# Patient Record
Sex: Male | Born: 1975
Health system: Southern US, Community
[De-identification: ages and names within clinical notes are randomized; demographics above are authoritative.]

## PROBLEM LIST (undated history)

## (undated) DIAGNOSIS — R569 Unspecified convulsions: Secondary | ICD-10-CM

## (undated) DIAGNOSIS — K219 Gastro-esophageal reflux disease without esophagitis: Secondary | ICD-10-CM

## (undated) HISTORY — PX: OTHER SURGICAL HISTORY: SHX169

---

## 1998-03-03 ENCOUNTER — Ambulatory Visit (HOSPITAL_COMMUNITY): Admission: RE | Admit: 1998-03-03 | Discharge: 1998-03-03 | Payer: Self-pay | Admitting: Neurology

## 2010-10-14 ENCOUNTER — Ambulatory Visit: Payer: Self-pay | Admitting: Orthopedic Surgery

## 2011-03-15 ENCOUNTER — Emergency Department (HOSPITAL_COMMUNITY)
Admission: EM | Admit: 2011-03-15 | Discharge: 2011-03-16 | Disposition: A | Discharge status: Home / Self Care | Payer: Commercial Managed Care - PPO | Attending: Emergency Medicine | Admitting: Emergency Medicine

## 2011-03-15 ENCOUNTER — Encounter: Payer: Self-pay | Admitting: *Deleted

## 2011-03-15 DIAGNOSIS — A084 Viral intestinal infection, unspecified: Secondary | ICD-10-CM

## 2011-03-15 DIAGNOSIS — A088 Other specified intestinal infections: Secondary | ICD-10-CM | POA: Insufficient documentation

## 2011-03-15 DIAGNOSIS — R197 Diarrhea, unspecified: Secondary | ICD-10-CM | POA: Insufficient documentation

## 2011-03-15 DIAGNOSIS — R569 Unspecified convulsions: Secondary | ICD-10-CM | POA: Insufficient documentation

## 2011-03-15 DIAGNOSIS — R51 Headache: Secondary | ICD-10-CM | POA: Insufficient documentation

## 2011-03-15 DIAGNOSIS — K219 Gastro-esophageal reflux disease without esophagitis: Secondary | ICD-10-CM | POA: Insufficient documentation

## 2011-03-15 DIAGNOSIS — R112 Nausea with vomiting, unspecified: Secondary | ICD-10-CM | POA: Insufficient documentation

## 2011-03-15 HISTORY — DX: Gastro-esophageal reflux disease without esophagitis: K21.9

## 2011-03-15 HISTORY — DX: Unspecified convulsions: R56.9

## 2011-03-15 LAB — BASIC METABOLIC PANEL
CO2: 24 mEq/L (ref 19–32)
Chloride: 98 mEq/L (ref 96–112)
Sodium: 135 mEq/L (ref 135–145)

## 2011-03-15 MED ORDER — ONDANSETRON 4 MG PO TBDP
ORAL_TABLET | ORAL | Status: AC
  Filled 2011-03-15: qty 2

## 2011-03-15 MED ORDER — ONDANSETRON 4 MG PO TBDP
8.0000 mg | ORAL_TABLET | Freq: Once | ORAL | Status: AC
  Administered 2011-03-15: 8 mg via ORAL

## 2011-03-15 NOTE — ED Notes (Signed)
Had seizure this am around 0700, developed HA afterward. H/o same. Took his carbatrol and imitrex and ate lunch, then vomitied, unable to keep meds, food or liquids down. HA better, nausea worse. Denies injury or fall. Alert, NAD calm, interactive, speech clear.

## 2011-03-16 ENCOUNTER — Emergency Department (HOSPITAL_COMMUNITY): Payer: Commercial Managed Care - PPO

## 2011-03-16 ENCOUNTER — Other Ambulatory Visit: Payer: Self-pay

## 2011-03-16 LAB — CBC
HCT: 44.2 % (ref 39.0–52.0)
Hemoglobin: 16 g/dL (ref 13.0–17.0)
RBC: 4.8 MIL/uL (ref 4.22–5.81)
RDW: 12.7 % (ref 11.5–15.5)
WBC: 14.6 10*3/uL — ABNORMAL HIGH (ref 4.0–10.5)

## 2011-03-16 LAB — DIFFERENTIAL
Basophils Absolute: 0 10*3/uL (ref 0.0–0.1)
Lymphocytes Relative: 6 % — ABNORMAL LOW (ref 12–46)
Lymphs Abs: 0.8 10*3/uL (ref 0.7–4.0)
Monocytes Absolute: 1.2 10*3/uL — ABNORMAL HIGH (ref 0.1–1.0)
Monocytes Relative: 8 % (ref 3–12)
Neutro Abs: 12.6 10*3/uL — ABNORMAL HIGH (ref 1.7–7.7)

## 2011-03-16 LAB — RAPID URINE DRUG SCREEN, HOSP PERFORMED
Amphetamines: NOT DETECTED
Benzodiazepines: NOT DETECTED

## 2011-03-16 LAB — URINALYSIS, ROUTINE W REFLEX MICROSCOPIC
Bilirubin Urine: NEGATIVE
Ketones, ur: NEGATIVE mg/dL
Nitrite: NEGATIVE
pH: 5.5 (ref 5.0–8.0)

## 2011-03-16 LAB — COMPREHENSIVE METABOLIC PANEL
Albumin: 4.3 g/dL (ref 3.5–5.2)
BUN: 26 mg/dL — ABNORMAL HIGH (ref 6–23)
CO2: 24 mEq/L (ref 19–32)
Chloride: 99 mEq/L (ref 96–112)
Creatinine, Ser: 2.69 mg/dL — ABNORMAL HIGH (ref 0.50–1.35)
GFR calc Af Amer: 33 mL/min — ABNORMAL LOW (ref >90)
GFR calc non Af Amer: 29 mL/min — ABNORMAL LOW (ref >90)
Total Bilirubin: 0.4 mg/dL (ref 0.3–1.2)

## 2011-03-16 LAB — ETHANOL: Alcohol, Ethyl (B): <11 mg/dL (ref 0–11)

## 2011-03-16 LAB — GLUCOSE, CAPILLARY: Glucose-Capillary: 132 mg/dL — ABNORMAL HIGH (ref 70–99)

## 2011-03-16 LAB — CARBAMAZEPINE LEVEL, TOTAL: Carbamazepine Lvl: 3.4 ug/mL — ABNORMAL LOW (ref 4.0–12.0)

## 2011-03-16 LAB — LIPASE, BLOOD: Lipase: 40 U/L (ref 11–59)

## 2011-03-16 MED ORDER — IBUPROFEN 800 MG PO TABS: 800.0000 mg | ORAL_TABLET | Freq: Three times a day (TID) | ORAL | Status: AC | PRN

## 2011-03-16 MED ORDER — DEXAMETHASONE SODIUM PHOSPHATE 10 MG/ML IJ SOLN
10.0000 mg | Freq: Once | INTRAMUSCULAR | Status: AC
  Administered 2011-03-16: 10 mg via INTRAVENOUS
  Filled 2011-03-16: qty 1

## 2011-03-16 MED ORDER — FAMOTIDINE 20 MG PO TABS: 20.0000 mg | ORAL_TABLET | Freq: Two times a day (BID) | ORAL | Status: DC | PRN

## 2011-03-16 MED ORDER — DIPHENHYDRAMINE HCL 50 MG/ML IJ SOLN
25.0000 mg | Freq: Once | INTRAMUSCULAR | Status: AC
  Administered 2011-03-16: 25 mg via INTRAVENOUS
  Filled 2011-03-16: qty 1

## 2011-03-16 MED ORDER — SODIUM CHLORIDE 0.9 % IV BOLUS (SEPSIS)
1000.0000 mL | Freq: Once | INTRAVENOUS | Status: AC
  Administered 2011-03-16: 1000 mL via INTRAVENOUS

## 2011-03-16 MED ORDER — PROMETHAZINE HCL 25 MG PO TABS: 25.0000 mg | ORAL_TABLET | Freq: Four times a day (QID) | ORAL | Status: AC | PRN

## 2011-03-16 MED ORDER — SODIUM CHLORIDE 0.9 % IV SOLN
INTRAVENOUS | Status: DC
  Administered 2011-03-16: 02:00:00 via INTRAVENOUS

## 2011-03-16 MED ORDER — PROMETHAZINE HCL 25 MG/ML IJ SOLN
25.0000 mg | Freq: Once | INTRAMUSCULAR | Status: AC
  Administered 2011-03-16: 25 mg via INTRAVENOUS
  Filled 2011-03-16: qty 1

## 2011-03-16 NOTE — ED Notes (Signed)
EKG performed by EMT Excell Seltzer

## 2011-03-16 NOTE — ED Notes (Signed)
Nausea improved after med, but nausea and HA returning/worsening.

## 2011-03-16 NOTE — ED Provider Notes (Addendum)
History     CSN: [id number] Arrival date & time: 03/15/2011  8:56 PM   First MD Initiated Contact with Patient 03/16/11 0038      Chief Complaint  Patient presents with  . Seizures    (Consider location/radiation/quality/duration/timing/severity/associated sxs/prior treatment) HPI Comments: The patient has a history of seizure disorder for which he is supposed to take carbamazepine, but had missed his doses for two days due to nausea/vomiting/diarrhea and reports "I couldn't keep the medicine down, I just threw it back up."  Yesterday he had a seizure, typical for his seizure disorder he tells me.  "I know why that happened (med. Noncompliance", he tells me, "but I've had this headache since and I can't stop vomiting."  He denies head injury, neck stiffness or pain, fever, chills, and rash.  On examination he has no nuchal rigidity.  Patient is a 35 y.o. male presenting with seizures, vomiting, and diarrhea. The history is provided by the patient.  Seizures  This is a recurrent problem. The current episode started yesterday. The problem has been resolved. There was 1 seizure. The most recent episode lasted 30 to 120 seconds. Associated symptoms include headaches, nausea, vomiting and diarrhea. Pertinent negatives include no sleepiness, no confusion, no speech difficulty, no visual disturbance, no neck stiffness, no sore throat, no chest pain, no cough and no muscle weakness. Characteristics include rhythmic jerking and loss of consciousness. Characteristics do not include bowel incontinence, bladder incontinence or bit tongue. The episode was witnessed. There was the sensation of an aura present. The seizures did not continue in the ED. The seizure(s) had no focality. Possible causes include missed seizure meds. There has been no fever. There were no medications administered prior to arrival.  Emesis  This is a new problem. The current episode started 2 days ago. The problem occurs 2 to 4 times  per day. The problem has not changed since onset.The emesis has an appearance of stomach contents. There has been no fever. Associated symptoms include diarrhea and headaches. Pertinent negatives include no abdominal pain, no arthralgias, no chills, no cough, no fever, no myalgias, no sweats and no URI.  Diarrhea The primary symptoms include fatigue, nausea, vomiting and diarrhea. Primary symptoms do not include fever, weight loss, abdominal pain, melena, hematemesis, jaundice, hematochezia, dysuria, myalgias, arthralgias or rash. The illness began 2 days ago. The onset was sudden. The problem has not changed since onset. The fatigue began 2 days ago. The fatigue has been unchanged since its onset.  Nausea began 2 days ago. The nausea is exacerbated by food.  The vomiting began 2 days ago. Vomiting occurs 2 to 5 times per day. The emesis contains stomach contents.  The illness is also significant for anorexia. The illness does not include chills, dysphagia, odynophagia, bloating, constipation, tenesmus, back pain or itching. Associated medical issues do not include liver disease or alcohol abuse.    Past Medical History  Diagnosis Date  . Seizures   . Acid reflux   . Migraine     History reviewed. No pertinent past surgical history.  History reviewed. No pertinent family history.  History  Substance Use Topics  . Smoking status: Never Smoker   . Smokeless tobacco: Not on file  . Alcohol Use: No      Review of Systems  Constitutional: Positive for appetite change and fatigue. Negative for fever, chills, weight loss, diaphoresis and unexpected weight change.  HENT: Negative for ear pain, congestion, sore throat, rhinorrhea, trouble swallowing, neck pain, neck  stiffness and postnasal drip.   Eyes: Negative for photophobia, pain, redness and visual disturbance.  Respiratory: Negative for cough and shortness of breath.   Cardiovascular: Negative for chest pain.  Gastrointestinal:  Positive for nausea, vomiting, diarrhea and anorexia. Negative for dysphagia, abdominal pain, constipation, blood in stool, melena, hematochezia, abdominal distention, rectal pain, bloating, hematemesis, bowel incontinence and jaundice.  Genitourinary: Negative for bladder incontinence, dysuria and decreased urine volume.  Musculoskeletal: Negative for myalgias, back pain and arthralgias.  Skin: Negative for color change, itching and rash.  Neurological: Positive for seizures, loss of consciousness and headaches. Negative for tremors, syncope, facial asymmetry, speech difficulty, weakness, light-headedness and numbness.  Hematological: Negative for adenopathy.  Psychiatric/Behavioral: Negative for confusion.    Allergies  Review of patient's allergies indicates no known allergies.  Home Medications   Current Outpatient Rx  Name Route Sig Dispense Refill  . CARBAMAZEPINE 300 MG PO CP12 Oral Take 300 mg by mouth 2 (two) times daily.      Marland Kitchen PANTOPRAZOLE SODIUM 40 MG PO TBEC Oral Take 40 mg by mouth daily.      Marland Kitchen FAMOTIDINE 20 MG PO TABS Oral Take 1 tablet (20 mg total) by mouth 2 (two) times daily as needed for heartburn (upset stomach). 14 tablet 0  . IBUPROFEN 800 MG PO TABS Oral Take 1 tablet (800 mg total) by mouth every 8 (eight) hours as needed for pain. 20 tablet 0  . PROMETHAZINE HCL 25 MG PO TABS Oral Take 1 tablet (25 mg total) by mouth every 6 (six) hours as needed for nausea. 20 tablet 0    BP 109/54  Pulse 79  Temp(Src) 98.4 F (36.9 C) (Oral)  Resp 16  SpO2 97%  Physical Exam  Nursing note and vitals reviewed. Constitutional: He is oriented to person, place, and time. He appears well-developed and well-nourished. He is active.  Non-toxic appearance. He does not have a sickly appearance. He does not appear ill. No distress.  HENT:  Head: Normocephalic and atraumatic.  Right Ear: Hearing, tympanic membrane, external ear and ear canal normal.  Left Ear: Hearing, tympanic  membrane, external ear and ear canal normal.  Nose: Nose normal. No mucosal edema.  Mouth/Throat: Uvula is midline and oropharynx is clear and moist. Mucous membranes are dry. No oral lesions. No uvula swelling. No oropharyngeal exudate, posterior oropharyngeal edema, posterior oropharyngeal erythema or tonsillar abscesses.  Eyes: Conjunctivae and EOM are normal. Pupils are equal, round, and reactive to light. Right eye exhibits no chemosis, no discharge and no exudate. Left eye exhibits no chemosis, no discharge and no exudate. Right conjunctiva is not injected. Left conjunctiva is not injected. No scleral icterus.  Fundoscopic exam:      The right eye shows no AV nicking, no hemorrhage and no papilledema.       The left eye shows no AV nicking, no hemorrhage and no papilledema.  Neck: Normal range of motion, full passive range of motion without pain and phonation normal. Neck supple. No rigidity. Normal range of motion present. No Brudzinski's sign and no Kernig's sign noted.  Cardiovascular: Normal rate, regular rhythm, normal heart sounds, intact distal pulses and normal pulses.   No extrasystoles are present. Exam reveals no gallop and no friction rub.   No murmur heard. Pulmonary/Chest: Effort normal and breath sounds normal. No accessory muscle usage. Not tachypneic. No respiratory distress. He has no decreased breath sounds. He has no wheezes. He has no rhonchi. He has no rales. He exhibits no  tenderness, no crepitus and no retraction.  Abdominal: Soft. Normal appearance. He exhibits no shifting dullness, no distension, no pulsatile liver, no fluid wave, no abdominal bruit, no ascites, no pulsatile midline mass and no mass. Bowel sounds are increased. There is no hepatosplenomegaly. There is no tenderness. There is no rigidity, no rebound and no guarding. No hernia.  Musculoskeletal: Normal range of motion. He exhibits no edema and no tenderness.  Lymphadenopathy:    He has no cervical  adenopathy.  Neurological: He is alert and oriented to person, place, and time. He has normal strength and normal reflexes. He is not disoriented. He displays no tremor and normal reflexes. No cranial nerve deficit or sensory deficit. He exhibits normal muscle tone. He displays no seizure activity. Coordination normal. GCS eye subscore is 4. GCS verbal subscore is 5. GCS motor subscore is 6.  Skin: Skin is warm, dry and intact. No bruising, no ecchymosis, no lesion and no rash noted. He is not diaphoretic. No erythema. No pallor.  Psychiatric: He has a normal mood and affect. His speech is normal and behavior is normal. Judgment and thought content normal. Cognition and memory are normal.    ED Course  Procedures (including critical care time)  Date: 03/16/2011  Rate: 75  Rhythm: normal sinus rhythm  QRS Axis: normal  Intervals: normal  ST/T Wave abnormalities: normal  Conduction Disutrbances:none  Narrative Interpretation: non-provocative ecg  Old EKG Reviewed: unchanged   Labs Reviewed  BASIC METABOLIC PANEL - Abnormal; Notable for the following:    Potassium 5.5 (*)    Glucose, Bld 223 (*)    BUN 24 (*)    Creatinine, Ser 2.43 (*)    GFR calc non Af Amer 33 (*)    GFR calc Af Amer 38 (*)    All other components within normal limits  CARBAMAZEPINE LEVEL, TOTAL - Abnormal; Notable for the following:    Carbamazepine Lvl 3.4 (*)    All other components within normal limits  CBC - Abnormal; Notable for the following:    WBC 14.6 (*)    MCHC 36.2 (*)    All other components within normal limits  DIFFERENTIAL - Abnormal; Notable for the following:    Neutrophils Relative 86 (*)    Neutro Abs 12.6 (*)    Lymphocytes Relative 6 (*)    Monocytes Absolute 1.2 (*)    All other components within normal limits  URINALYSIS, ROUTINE W REFLEX MICROSCOPIC - Abnormal; Notable for the following:    Protein, ur 30 (*)    All other components within normal limits  COMPREHENSIVE METABOLIC  PANEL - Abnormal; Notable for the following:    Glucose, Bld 148 (*)    BUN 26 (*)    Creatinine, Ser 2.69 (*)    GFR calc non Af Amer 29 (*)    GFR calc Af Amer 33 (*)    All other components within normal limits  GLUCOSE, CAPILLARY - Abnormal; Notable for the following:    Glucose-Capillary 132 (*)    All other components within normal limits  URINE RAPID DRUG SCREEN (HOSP PERFORMED)  ETHANOL  LIPASE, BLOOD  URINE MICROSCOPIC-ADD ON   Ct Head Wo Contrast  03/16/2011  *RADIOLOGY REPORT*  Clinical Data: Seizure, headache.  CT HEAD WITHOUT CONTRAST  Technique:  Contiguous axial images were obtained from the base of the skull through the vertex without contrast.  Comparison: None.  Findings: Tiny focus of high attenuation within the right frontal lobe (image 18).  There is no associated mass effect.  Does not appear to conform to a sulcus.  Otherwise, there is no definite evidence for acute hemorrhage, hydrocephalus, mass lesion, or abnormal extra-axial fluid collection.  No definite CT evidence for acute infarction.  The visualized paranasal sinuses and mastoid air cells are predominately clear.  IMPRESSION: Tiny focus of high attenuation within the right frontal lobe is nonspecific.  No associated mass effect however a petechial hemorrhage cannot be excluded.  Comparison with prior outside imaging if available would be beneficial.  Alternatively, given the seizure history, if the patient has never had an MRI, this should be considered.  Discussed via telephone with Dr. Fredricka Bonine at 02:30 a.m. on 03/16/2011.  Original Report Authenticated By: Waneta Martins, M.D.     1. Seizure   2. Viral gastroenteritis   3. Headache       MDM  The patient appears to have contracted a gastroenteritis, likely viral, that prevented him from taking his anticonvulsant medications and thereby precipitated a seizure amongst an untreated seizure disorder.  Due to his persistent headache and the nausea/vomiting,  as these symptoms could in dicate a cerebral malignancy, I obtained a head CT which was normal but for a tiny hyperdensity in the right frontal lobe.  Radiologist in discussion with me, believes this could be calcification, or petechial hemorrhage, but that there is no edema or mass effect and that it is not likely responsible for the symptoms or significant.  To be sure, I contacted Neurosurgery on-call, Dr. Venetia Maxon, who reviewed the images and after I relayed the details of the patient's case, does not believe that this represents any concerning pathology and advises no further studies or management of this finding is needed.  The patient's headache and nausea/vomiting resolved after medications, and he is awake, alert, and oriented appropriately, expressing his desire for discharge home which at this time I find to be suitable.  I notified the patient of his CT and laboratory findings including the hyperdensity in the frontal lobe and of mild renal insufficiency which should be followed up with his primary care physician, Dr. Phillips Odor.  The patient expresses his understanding of the findings and diagnoses, and agrees with the plan of care.       Felisa Bonier, MD 03/16/11 2230  Felisa Bonier, MD 03/16/11 [phone]

## 2012-06-27 ENCOUNTER — Encounter: Payer: Self-pay | Admitting: Nurse Practitioner

## 2012-07-02 ENCOUNTER — Encounter: Payer: Self-pay | Admitting: Nurse Practitioner

## 2012-07-28 ENCOUNTER — Ambulatory Visit: Payer: Self-pay | Admitting: Nurse Practitioner

## 2012-09-27 ENCOUNTER — Other Ambulatory Visit: Payer: Self-pay

## 2012-09-27 MED ORDER — CARBAMAZEPINE ER 300 MG PO CP12: 300.0000 mg | ORAL_CAPSULE | Freq: Two times a day (BID) | ORAL | Status: DC

## 2012-09-27 NOTE — Telephone Encounter (Signed)
Spouse called and said they need a refill sent to [facility].

## 2012-10-10 ENCOUNTER — Encounter: Payer: Self-pay | Admitting: Nurse Practitioner

## 2012-10-10 ENCOUNTER — Ambulatory Visit (INDEPENDENT_AMBULATORY_CARE_PROVIDER_SITE_OTHER): Payer: Commercial Managed Care - PPO | Admitting: Nurse Practitioner

## 2012-10-10 VITALS — BP 128/75 | HR 77 | Ht 72.0 in | Wt 227.0 lb

## 2012-10-10 DIAGNOSIS — Z5181 Encounter for therapeutic drug level monitoring: Secondary | ICD-10-CM | POA: Insufficient documentation

## 2012-10-10 DIAGNOSIS — G40319 Generalized idiopathic epilepsy and epileptic syndromes, intractable, without status epilepticus: Secondary | ICD-10-CM | POA: Insufficient documentation

## 2012-10-10 DIAGNOSIS — Z79899 Other long term (current) drug therapy: Secondary | ICD-10-CM

## 2012-10-10 DIAGNOSIS — G40309 Generalized idiopathic epilepsy and epileptic syndromes, not intractable, without status epilepticus: Secondary | ICD-10-CM

## 2012-10-10 NOTE — Patient Instructions (Addendum)
Will continue Carbatrol at current dose for now.  Will check CBC, CMP and CBZ this afternoon at 3:45p F/U yearly  .

## 2012-10-10 NOTE — Progress Notes (Signed)
HPI: Patient returns for followup after last visit 07/20/2011. He has a history of seizure disorder onset in December 1991. Carbatrol was initiated in October 1999 and he has been well controlled on the drug since that time. Last seizure was in January, he had missed a dose of his medication. He has had trials of Depakote and Dilantin with compliance issues and side effects.  He denies any side effects to his Carbatrol, feelings of being off balance, no daytime drowsiness. He has no new neurologic complaints  ROS:  negative  Physical Exam General: well developed, well nourished, seated, in no evident distress Head: head normocephalic and atraumatic. Oropharynx benign Neck: supple with no carotid  bruits Cardiovascular: regular rate and rhythm, no murmurs  Neurologic Exam Mental Status: Awake and fully alert. Oriented to place and time. Follows all commands. Speech and language normal.   Cranial Nerves:  Pupils equal, briskly reactive to light. Extraocular movements full without nystagmus. Visual fields full to confrontation. Hearing intact and symmetric to finger snap. Facial sensation intact. Face, tongue, palate move normally and symmetrically. Neck flexion and extension normal.  Motor: Normal bulk and tone. Normal strength in all tested extremity muscles.No focal weakness Sensory.: intact to touch and pinprick and vibratory.  Coordination: Rapid alternating movements normal in all extremities. Finger-to-nose and heel-to-shin performed accurately bilaterally. Gait and Station: Arises from chair without difficulty. Stance is normal. Gait demonstrates normal stride length and balance . Able to heel, toe and tandem walk without difficulty.  Reflexes: 2+ and symmetric. Toes downgoing.     ASSESSMENT: Chronic seizure disorder in good control on Carbatrol, normal neurologic exam     PLAN: Will check labs late this afternoon since he took his Carbatrol dose at 6 AM Will renew meds after results  of labs her back  Nilda Riggs, GNP-BC APRN

## 2012-10-10 NOTE — Progress Notes (Signed)
I have read the note, and I agree with the clinical assessment and plan.  

## 2012-10-11 LAB — CBC WITH DIFFERENTIAL
Eosinophils Absolute: 0.1 10*3/uL (ref 0.0–0.4)
Immature Grans (Abs): 0 10*3/uL (ref 0.0–0.1)
Immature Granulocytes: 0 % (ref 0–2)
Lymphocytes Absolute: 2.1 10*3/uL (ref 0.7–3.1)
MCH: 33.6 pg — ABNORMAL HIGH (ref 26.6–33.0)
MCHC: 35.5 g/dL (ref 31.5–35.7)
Monocytes Absolute: 0.5 10*3/uL (ref 0.1–0.9)
Neutrophils Relative %: 62 % (ref 40–74)
Platelets: 202 10*3/uL (ref 155–379)
RDW: 13.6 % (ref 12.3–15.4)

## 2012-10-11 LAB — COMPREHENSIVE METABOLIC PANEL
AST: 26 IU/L (ref 0–40)
Alkaline Phosphatase: 103 IU/L (ref 39–117)
BUN/Creatinine Ratio: 17 (ref 8–19)
CO2: 22 mmol/L (ref 19–28)
Chloride: 101 mmol/L (ref 97–108)
GFR calc Af Amer: 104 mL/min/{1.73_m2} (ref >59)
Globulin, Total: 2.5 g/dL (ref 1.5–4.5)
Sodium: 142 mmol/L (ref 134–144)
Total Bilirubin: 0.2 mg/dL (ref 0.0–1.2)

## 2012-10-11 NOTE — Progress Notes (Signed)
Quick Note:  I called and left a message for the patient that his blood work was normal. ______ 

## 2012-12-07 ENCOUNTER — Other Ambulatory Visit: Payer: Self-pay

## 2012-12-07 MED ORDER — CARBAMAZEPINE ER 300 MG PO CP12: 300.0000 mg | ORAL_CAPSULE | Freq: Two times a day (BID) | ORAL | Status: DC

## 2013-05-21 ENCOUNTER — Other Ambulatory Visit: Payer: Self-pay

## 2013-05-21 MED ORDER — CARBAMAZEPINE ER 300 MG PO CP12: 300.0000 mg | ORAL_CAPSULE | Freq: Two times a day (BID) | ORAL | Status: DC

## 2013-05-23 ENCOUNTER — Telehealth: Payer: Self-pay | Admitting: Neurology

## 2013-05-23 MED ORDER — CARBAMAZEPINE ER 300 MG PO CP12: 300.0000 mg | ORAL_CAPSULE | Freq: Two times a day (BID) | ORAL | Status: DC

## 2013-05-23 NOTE — Telephone Encounter (Signed)
Rx has been sent  

## 2013-05-23 NOTE — Telephone Encounter (Signed)
Patient called and is out of town and he has lost his luggage and does not have extra medication with him. Patient is requesting Carbamazepine refill sent to [facility] in [place], [place].

## 2013-06-15 ENCOUNTER — Telehealth: Payer: Self-pay | Admitting: Nurse Practitioner

## 2013-06-15 NOTE — Telephone Encounter (Signed)
I called back.  Spoke with Francisco [name].  She looked in the system and said they already see a Rx was sent last month.  Nothing further is needed.

## 2013-06-15 NOTE — Telephone Encounter (Signed)
Francisco [name] with [facility] [phone] needs a call back regarding verification of a prescription for patient. Reference # is [id number]. Please call [facility].

## 2013-06-16 ENCOUNTER — Telehealth: Payer: Self-pay | Admitting: Neurology

## 2013-06-16 MED ORDER — CARBAMAZEPINE ER 300 MG PO CP12: 300.0000 mg | ORAL_CAPSULE | Freq: Two times a day (BID) | ORAL | Status: DC

## 2013-06-16 NOTE — Telephone Encounter (Signed)
Needs RX called in for Carbamazepine..having problems with [facility] and RX will not be sent until 5 days..needs 7 day supply called in..[facility] [address]

## 2013-06-16 NOTE — Telephone Encounter (Signed)
[facility]  I added one additional refill for the patient to have on file in the event he has issues with mail order in the future.

## 2013-08-28 ENCOUNTER — Telehealth: Payer: Self-pay | Admitting: *Deleted

## 2013-08-28 NOTE — Telephone Encounter (Signed)
I called back.  Spoke with Belenda Cruise.  She said the ref number given is not valid.  She looked up the patient.  She said they have everything they need from [place], they are waiting to speak with the patient to verify order prior to shipping.  She says they will reach out to the patient again today.  States nothing further is needed from [place].  I called the patient back.  He is aware.

## 2013-08-28 NOTE — Telephone Encounter (Signed)
Pt called and stated [facility] has requested numerous times rx for carbamazepine (CARBATROL) 300 MG 12 hr capsule  [facility]  Ref# [id number] and phone # [phone].  Please advise.

## 2013-08-31 ENCOUNTER — Telehealth: Payer: Self-pay | Admitting: Neurology

## 2013-08-31 MED ORDER — CARBAMAZEPINE ER 300 MG PO CP12: 300.0000 mg | ORAL_CAPSULE | Freq: Two times a day (BID) | ORAL | Status: DC

## 2013-08-31 NOTE — Telephone Encounter (Signed)
Patient calling to state that [facility] has notified him of an issue with his script, the reference number is [id number]. Patient needs 10 day script of [facility]amazep[facility] [address]

## 2013-08-31 NOTE — Telephone Encounter (Signed)
Rx has been sent with one additional refill in case the patient has issues with mail order in the future.

## 2013-10-11 ENCOUNTER — Encounter (INDEPENDENT_AMBULATORY_CARE_PROVIDER_SITE_OTHER): Payer: Self-pay

## 2013-10-11 ENCOUNTER — Encounter: Payer: Self-pay | Admitting: Nurse Practitioner

## 2013-10-11 ENCOUNTER — Ambulatory Visit (INDEPENDENT_AMBULATORY_CARE_PROVIDER_SITE_OTHER): Payer: Commercial Managed Care - PPO | Admitting: Nurse Practitioner

## 2013-10-11 VITALS — BP 137/79 | HR 80 | Ht 72.0 in | Wt 232.0 lb

## 2013-10-11 DIAGNOSIS — Z79899 Other long term (current) drug therapy: Secondary | ICD-10-CM

## 2013-10-11 DIAGNOSIS — G40319 Generalized idiopathic epilepsy and epileptic syndromes, intractable, without status epilepticus: Secondary | ICD-10-CM

## 2013-10-11 LAB — CBC WITH DIFFERENTIAL/PLATELET
BASOS ABS: 0 10*3/uL (ref 0.0–0.2)
Basos: 1 %
EOS: 2 %
Eosinophils Absolute: 0.2 10*3/uL (ref 0.0–0.4)
HEMATOCRIT: 45 % (ref 37.5–51.0)
HEMOGLOBIN: 16.2 g/dL (ref 12.6–17.7)
LYMPHS ABS: 1.8 10*3/uL (ref 0.7–3.1)
Lymphs: 27 %
MCH: 33.2 pg — ABNORMAL HIGH (ref 26.6–33.0)
MCHC: 36 g/dL — AB (ref 31.5–35.7)
MCV: 92 fL (ref 79–97)
MONOCYTES: 8 %
Monocytes Absolute: 0.6 10*3/uL (ref 0.1–0.9)
NEUTROS ABS: 4.2 10*3/uL (ref 1.4–7.0)
Neutrophils Relative %: 62 %
RBC: 4.88 x10E6/uL (ref 4.14–5.80)
RDW: 14 % (ref 12.3–15.4)
WBC: 6.7 10*3/uL (ref 3.4–10.8)

## 2013-10-11 LAB — COMPREHENSIVE METABOLIC PANEL
A/G RATIO: 1.5 (ref 1.1–2.5)
ALK PHOS: 110 IU/L (ref 39–117)
ALT: 39 IU/L (ref 0–44)
AST: 30 IU/L (ref 0–40)
Albumin: 4.5 g/dL (ref 3.5–5.5)
BILIRUBIN TOTAL: 0.3 mg/dL (ref 0.0–1.2)
BUN/Creatinine Ratio: 12 (ref 8–19)
BUN: 11 mg/dL (ref 6–20)
CO2: 28 mmol/L (ref 18–29)
Calcium: 9.3 mg/dL (ref 8.7–10.2)
Chloride: 100 mmol/L (ref 96–108)
Creatinine, Ser: 0.93 mg/dL (ref 0.76–1.27)
GFR, EST AFRICAN AMERICAN: 119 mL/min/{1.73_m2} (ref >59)
GFR, EST NON AFRICAN AMERICAN: 103 mL/min/{1.73_m2} (ref >59)
Globulin, Total: 3 g/dL (ref 1.5–4.5)
Glucose: 106 mg/dL — ABNORMAL HIGH (ref 65–99)
POTASSIUM: 4.1 mmol/L (ref 3.5–5.2)
SODIUM: 138 mmol/L (ref 134–144)
Total Protein: 7.5 g/dL (ref 6.0–8.5)

## 2013-10-11 MED ORDER — CARBAMAZEPINE ER 300 MG PO CP12: 300.0000 mg | ORAL_CAPSULE | Freq: Two times a day (BID) | ORAL | Status: DC

## 2013-10-11 NOTE — Progress Notes (Signed)
[facility]  PATIENT: Francisco [name] DOB: Jan 03, 1976   REASON FOR VISIT: Followup for seizure disorder   HISTORY OF PRESENT ILLNESS:Francisco [name], 38 year old male returns for followup.He has a history of seizure disorder onset in December 1991. Carbatrol was initiated in October 1999 and he has been well controlled on the drug since that time. Last seizure was in January 2014, he had missed a dose of his medication. He has had trials of Depakote and Dilantin with compliance issues and side effects. He denies any side effects to his Carbatrol, feelings of being off balance, no daytime drowsiness. He has no new neurologic complaint. He returns for reevaluation. He needs labs and refills.   REVIEW OF SYSTEMS: Full 14 system review of systems performed and notable only for those listed, all others are neg:  Constitutional: N/A  Cardiovascular: N/A  Ear/Nose/Throat: N/A  Skin: N/A  Eyes: N/A  Respiratory: N/A  Gastroitestinal: N/A  Hematology/Lymphatic: N/A  Endocrine: N/A Musculoskeletal:N/A  Allergy/Immunology: N/A  Neurological: N/A Psychiatric: N/A Sleep : NA   ALLERGIES: No Known Allergies  HOME MEDICATIONS: Outpatient Prescriptions Prior to Visit  Medication Sig Dispense Refill  . carbamazepine (CARBATROL) 300 MG 12 hr capsule Take 1 capsule (300 mg total) by mouth 2 (two) times daily.  30 capsule  1  . Multiple Vitamins-Minerals (QC MENS DAILY MULTIVITAMIN PO) Take by mouth daily.      . pantoprazole (PROTONIX) 40 MG tablet Take 40 mg by mouth daily.         No facility-administered medications prior to visit.    PAST MEDICAL HISTORY: Past Medical History  Diagnosis Date  . Seizures   . Acid reflux   . Migraine     PAST SURGICAL HISTORY: History reviewed. No pertinent past surgical history.  FAMILY HISTORY: History reviewed. No pertinent family history.  SOCIAL HISTORY: History   Social History  . Marital Status: Married    Spouse  Name: Francisco [name]    Number of Children: 3  . Years of Education: 12+   Occupational History  .  [employer]   Social History Main Topics  . Smoking status: Never Smoker   . Smokeless tobacco: Never Used  . Alcohol Use: No  . Drug Use: No  . Sexual Activity: Not on file   Other Topics Concern  . Not on file   Social History Narrative   Patient lives at home with wife Francisco [name].    Patient have 3 children.    Patient has some college.    Patient is currently working at [employer].    Patient is right handed.      PHYSICAL EXAM  Filed Vitals:   10/11/13 1101  BP: 137/79  Pulse: 80  Height: 6' (1.829 m)  Weight: 232 lb (105.235 kg)   Body mass index is 31.46 kg/(m^2).  Generalized: Well developed, in no acute distress  Neurological examination   Mentation: Alert oriented to time, place, history taking. Follows all commands speech and language fluent  Cranial nerve II-XII: Pupils were equal round reactive to light extraocular movements were full, visual field were full on confrontational test. Facial sensation and strength were normal. hearing was intact to finger rubbing bilaterally. Uvula tongue midline. head turning and shoulder shrug were normal and symmetric.Tongue protrusion into cheek strength was normal. Motor: normal bulk and tone, full strength in the BUE, BLE, fine finger movements normal, no pronator drift. No focal weakness Coordination: finger-nose-finger, heel-to-shin bilaterally, no dysmetria Reflexes: Brachioradialis 2/2, biceps  2/2, triceps 2/2, patellar 2/2, Achilles 2/2, plantar responses were flexor bilaterally. Gait and Station: Rising up from seated position without assistance, normal stance,  moderate stride, good arm swing, smooth turning, able to perform tiptoe, and heel walking without difficulty. Tandem gait is steady  DIAGNOSTIC DATA (LABS, IMAGING, TESTING) - I reviewed patient records, labs, notes, testing and imaging myself where  available.  Lab Results  Component Value Date   WBC 7.2 10/10/2012   HGB 15.4 10/10/2012   HCT 43.4 10/10/2012   MCV 95 10/10/2012   PLT 202 10/10/2012      Component Value Date/Time   NA 142 10/10/2012 1535   NA 137 03/16/2011 0105   K 4.3 10/10/2012 1535   CL 101 10/10/2012 1535   CO2 22 10/10/2012 1535   GLUCOSE 97 10/10/2012 1535   GLUCOSE 148* 03/16/2011 0105   BUN 18 10/10/2012 1535   BUN 26* 03/16/2011 0105   CREATININE 1.05 10/10/2012 1535   CALCIUM 9.3 10/10/2012 1535   PROT 7.1 10/10/2012 1535   PROT 7.9 03/16/2011 0105   ALBUMIN 4.3 03/16/2011 0105   AST 26 10/10/2012 1535   ALT 36 10/10/2012 1535   ALKPHOS 103 10/10/2012 1535   BILITOT 0.2 10/10/2012 1535   GFRNONAA 90 10/10/2012 1535   GFRAA 104 10/10/2012 1535    ASSESSMENT AND PLAN  38 y.o. year old male  has a past medical history of generalized seizure disorder  here to followup. Last seizure was January 2014 after missing a dose of his Carbatrol.  Continue Carbatrol at current dose will refill Will check labs today Call for any seizure activity Followup yearly and when necessary Francisco Bible, [facility], [facility], [facility] [address] [place],  [address] [phone]

## 2013-10-11 NOTE — Patient Instructions (Signed)
Continue Carbatrol at current dose will refill Will check labs today Call for any seizure activity Followup yearly and when necessary

## 2013-10-11 NOTE — Progress Notes (Signed)
I have read the note, and I agree with the clinical assessment and plan.  Francisco [name] K Dajanique Robley   

## 2013-10-12 NOTE — Progress Notes (Signed)
Quick Note:  I called and LMVM for pt on home # that labs looked good. He is to call back if questions. ______

## 2014-04-03 ENCOUNTER — Encounter: Payer: Self-pay | Admitting: Nurse Practitioner

## 2014-05-24 ENCOUNTER — Ambulatory Visit (INDEPENDENT_AMBULATORY_CARE_PROVIDER_SITE_OTHER): Payer: Commercial Managed Care - PPO | Admitting: Nurse Practitioner

## 2014-05-24 ENCOUNTER — Encounter: Payer: Self-pay | Admitting: Nurse Practitioner

## 2014-05-24 VITALS — BP 134/89 | HR 99 | Ht 71.0 in | Wt 233.3 lb

## 2014-05-24 DIAGNOSIS — Z5181 Encounter for therapeutic drug level monitoring: Secondary | ICD-10-CM

## 2014-05-24 DIAGNOSIS — G40309 Generalized idiopathic epilepsy and epileptic syndromes, not intractable, without status epilepticus: Secondary | ICD-10-CM

## 2014-05-24 NOTE — Progress Notes (Signed)
I have read the note, and I agree with the clinical assessment and plan.  Toshika Parrow KEITH   

## 2014-05-24 NOTE — Patient Instructions (Signed)
Continue Carbatrol at current dose for now Check CBC, CMP and CBZ level Will adjust meds if necessary Call for any overt seizure activity Follow-up yearly and when necessary

## 2014-05-24 NOTE — Progress Notes (Signed)
[facility]  PATIENT: Francisco [name] DOB: 1975/10/21   REASON FOR VISIT: For seizure disorder  HISTORY FROM: patient    HISTORY OF PRESENT ILLNESS:Mr. Frangos, 39 year old male returns for followup.He has a history of seizure disorder onset in December 1991. Carbatrol was initiated in October 1999 and he has been well controlled on the drug since that time. Last seizure was in January 2014, he had missed a dose of his medication. He says that in the last month he has had 3-4 episodes of feeling like he was going to have seizure activity. He denies missing any doses of his medication. He has had trials of Depakote and Dilantin with compliance issues and side effects. He denies any side effects to his Carbatrol, feelings of being off balance, no daytime drowsiness.  He returns for reevaluation. He needs labs and refills.  REVIEW OF SYSTEMS: Full 14 system review of systems performed and notable only for those listed, all others are neg:  Constitutional: neg  Cardiovascular: neg Ear/Nose/Throat: neg  Skin: neg Eyes: neg Respiratory: neg Gastroitestinal: neg  Hematology/Lymphatic: neg  Endocrine: neg Musculoskeletal:neg Allergy/Immunology: neg Neurological: Seizure disorder Psychiatric: neg Sleep : neg   ALLERGIES: No Known Allergies  HOME MEDICATIONS: Outpatient Prescriptions Prior to Visit  Medication Sig Dispense Refill  . carbamazepine (CARBATROL) 300 MG 12 hr capsule Take 1 capsule (300 mg total) by mouth 2 (two) times daily. 180 capsule 3  . Multiple Vitamins-Minerals (QC MENS DAILY MULTIVITAMIN PO) Take by mouth daily.    . pantoprazole (PROTONIX) 40 MG tablet Take 40 mg by mouth daily.       No facility-administered medications prior to visit.    PAST MEDICAL HISTORY: Past Medical History  Diagnosis Date  . Seizures   . Acid reflux   . Migraine     PAST SURGICAL HISTORY: History reviewed. No pertinent past surgical history.  FAMILY  HISTORY: History reviewed. No pertinent family history.  SOCIAL HISTORY: History   Social History  . Marital Status: Married    Spouse Name: [name]    Number of Children: 3  . Years of Education: 12+   Occupational History  .  [employer]   Social History Main Topics  . Smoking status: Never Smoker   . Smokeless tobacco: Never Used  . Alcohol Use: No  . Drug Use: No  . Sexual Activity: Not on file   Other Topics Concern  . Not on file   Social History Narrative   Patient lives at home with wife Domingos Riggi.    Patient have 3 children.    Patient has some college.    Patient is currently working at [employer].    Patient is right handed.      PHYSICAL EXAM  Filed Vitals:   05/24/14 1522  BP: 134/89  Pulse: 99  Height: 5\' 11"  (1.803 m)  Weight: 233 lb 4.8 oz (105.824 kg)   Body mass index is 32.55 kg/(m^2).  Generalized: Well developed, in no acute distress  Head: normocephalic and atraumatic,. Oropharynx benign  Neck: Supple, no carotid bruits  Cardiac: Regular rate rhythm, no murmur  Musculoskeletal: No deformity   Neurological examination   Mentation: Alert oriented to time, place, history taking. Attention span and concentration appropriate. Recent and remote memory intact.  Follows all commands speech and language fluent.   Cranial nerve II-XII: Fundoscopic exam deferred Pupils were equal round reactive to light extraocular movements were full, visual field were full on confrontational test. Facial sensation  and strength were normal. hearing was intact to finger rubbing bilaterally. Uvula tongue midline. head turning and shoulder shrug were normal and symmetric.Tongue protrusion into cheek strength was normal. Motor: normal bulk and tone, full strength in the BUE, BLE, fine finger movements normal, no pronator drift. No focal weakness Coordination: finger-nose-finger, heel-to-shin bilaterally, no dysmetria Reflexes: Brachioradialis 2/2, biceps 2/2,  triceps 2/2, patellar 2/2, Achilles 2/2, plantar responses were flexor bilaterally. Gait and Station: Rising up from seated position without assistance, normal stance,  moderate stride, good arm swing, smooth turning, able to perform tiptoe, and heel walking without difficulty. Tandem gait is steady.  DIAGNOSTIC DATA (LABS, IMAGING, TESTING) - I reviewed patient records, labs, notes, testing and imaging myself where available.  Lab Results  Component Value Date   WBC 6.7 10/11/2013   HGB 16.2 10/11/2013   HCT 45.0 10/11/2013   MCV 92 10/11/2013   PLT 202 10/10/2012      Component Value Date/Time   NA 138 10/11/2013 1123   NA 137 03/16/2011 0105   K 4.1 10/11/2013 1123   CL 100 10/11/2013 1123   CO2 28 10/11/2013 1123   GLUCOSE 106* 10/11/2013 1123   GLUCOSE 148* 03/16/2011 0105   BUN 11 10/11/2013 1123   BUN 26* 03/16/2011 0105   CREATININE 0.93 10/11/2013 1123   CALCIUM 9.3 10/11/2013 1123   PROT 7.5 10/11/2013 1123   PROT 7.9 03/16/2011 0105   ALBUMIN 4.3 03/16/2011 0105   AST 30 10/11/2013 1123   ALT 39 10/11/2013 1123   ALKPHOS 110 10/11/2013 1123   BILITOT 0.3 10/11/2013 1123   GFRNONAA 103 10/11/2013 1123   GFRAA 119 10/11/2013 1123    ASSESSMENT AND PLAN  39 y.o. year old male  has a past medical history of Seizures disorder with 3-4 episodes of feeling like he was going to have a seizure but did not. He denies missing any doses of medication.  Continue Carbatrol at current dose for now Check CBC, CMP and CBZ level Will adjust meds if necessary due to recent events Call for any overt seizure activity Follow-up yearly and when necessary  Dennie Bible, [facility], [facility], [facility] [address], [place], [place] [address] [phone]

## 2014-05-25 ENCOUNTER — Telehealth: Payer: Self-pay | Admitting: Nurse Practitioner

## 2014-05-25 LAB — CBC WITH DIFFERENTIAL/PLATELET
Basophils Absolute: 0 10*3/uL (ref 0.0–0.2)
Basos: 1 %
EOS ABS: 0.2 10*3/uL (ref 0.0–0.4)
EOS: 2 %
HCT: 45.1 % (ref 37.5–51.0)
HEMOGLOBIN: 16 g/dL (ref 12.6–17.7)
IMMATURE GRANS (ABS): 0 10*3/uL (ref 0.0–0.1)
IMMATURE GRANULOCYTES: 0 %
LYMPHS: 32 %
Lymphocytes Absolute: 2.1 10*3/uL (ref 0.7–3.1)
MCH: 33.1 pg — AB (ref 26.6–33.0)
MCHC: 35.5 g/dL (ref 31.5–35.7)
MCV: 93 fL (ref 79–97)
MONOS ABS: 0.9 10*3/uL (ref 0.1–0.9)
Monocytes: 14 %
NEUTROS ABS: 3.3 10*3/uL (ref 1.4–7.0)
NEUTROS PCT: 51 %
RBC: 4.84 x10E6/uL (ref 4.14–5.80)
RDW: 13.6 % (ref 12.3–15.4)
WBC: 6.5 10*3/uL (ref 3.4–10.8)

## 2014-05-25 LAB — COMPREHENSIVE METABOLIC PANEL
A/G RATIO: 1.7 (ref 1.1–2.5)
ALBUMIN: 4.7 g/dL (ref 3.5–5.5)
ALK PHOS: 109 IU/L (ref 39–117)
ALT: 18 IU/L (ref 0–44)
AST: 22 IU/L (ref 0–40)
BUN/Creatinine Ratio: 15 (ref 9–20)
BUN: 14 mg/dL (ref 6–24)
CO2: 24 mmol/L (ref 18–29)
Calcium: 9.3 mg/dL (ref 8.7–10.2)
Chloride: 98 mmol/L (ref 97–108)
Creatinine, Ser: 0.94 mg/dL (ref 0.76–1.27)
GFR calc Af Amer: 117 mL/min/{1.73_m2} (ref >59)
GFR calc non Af Amer: 101 mL/min/{1.73_m2} (ref >59)
GLOBULIN, TOTAL: 2.7 g/dL (ref 1.5–4.5)
Glucose: 69 mg/dL (ref 65–99)
POTASSIUM: 4.5 mmol/L (ref 3.5–5.2)
Sodium: 138 mmol/L (ref 134–144)
TOTAL PROTEIN: 7.4 g/dL (ref 6.0–8.5)
Total Bilirubin: 0.2 mg/dL (ref 0.0–1.2)

## 2014-05-25 LAB — CARBAMAZEPINE LEVEL, TOTAL: CARBAMAZEPINE LVL: 9.1 ug/mL (ref 4.0–12.0)

## 2014-05-25 MED ORDER — CARBAMAZEPINE ER 100 MG PO CP12: 100.0000 mg | ORAL_CAPSULE | Freq: Every day | ORAL | Status: DC

## 2014-05-25 MED ORDER — CARBAMAZEPINE ER 300 MG PO CP12: 300.0000 mg | ORAL_CAPSULE | Freq: Two times a day (BID) | ORAL | Status: DC

## 2014-05-25 NOTE — Telephone Encounter (Signed)
Jessica please call patiient and ask him to add additional 100mg  of Carbitrol at night. I would like to repeat level in 2 weeks. Please ask if he wants the 100mg  cap sent to local pharmacy. I reordered the 300mg . Thanks

## 2014-05-25 NOTE — Telephone Encounter (Signed)
Patient called and stated he's been taking 600 mg in am and 600 mg in PM of Rx carbamazepine (CARBATROL) 300 MG 12 hr capsule for the last 3 days.  Received call stating levels were llow and need to take additonal 100 mg, which would increase dosage to 700 mg.  Patient is confused about increasing to 700 mg and he's been taking 1200 mg daily.  Please call and advise.

## 2014-05-25 NOTE — Telephone Encounter (Signed)
I called back.  Patient says he understands to add additional 100mg  of Carbatrol at night.  Says he had increased his dose (on his own) to 600mg  in am and 600mg  in pm for the past several days.  I explained our records show he should now be taking 300mg  in am and 400mg  in pm.  He understands, but says he would still like a message sent to [facility] verifying that he should only take 300mg  am/400mg  pm and not the dose he began taking on his own.

## 2014-05-25 NOTE — Telephone Encounter (Signed)
I called the patient.  Relayed Carolyn's message.  He verbalized understanding.  Asked that we send a one month supply of new Carbatrol dose to [facility].  Rx has been sent.

## 2014-05-25 NOTE — Telephone Encounter (Signed)
He will stay with 300mg  am and 400mg  pm. Explained that he should not double the dose that can also cause problems. Labs in 2 weeks

## 2014-06-13 ENCOUNTER — Telehealth: Payer: Self-pay | Admitting: Nurse Practitioner

## 2014-06-13 DIAGNOSIS — Z5181 Encounter for therapeutic drug level monitoring: Secondary | ICD-10-CM

## 2014-06-13 NOTE — Telephone Encounter (Signed)
Pt needs repeat carbamaepine level. Please call him to come in. Orders already placed

## 2014-06-13 NOTE — Telephone Encounter (Signed)
Called carbamazepine level needs repeat patient understood process and relayed lab orders.

## 2014-06-27 ENCOUNTER — Other Ambulatory Visit: Payer: Self-pay | Admitting: Nurse Practitioner

## 2014-06-27 ENCOUNTER — Other Ambulatory Visit (INDEPENDENT_AMBULATORY_CARE_PROVIDER_SITE_OTHER): Payer: Self-pay

## 2014-06-27 DIAGNOSIS — Z5181 Encounter for therapeutic drug level monitoring: Secondary | ICD-10-CM

## 2014-06-27 DIAGNOSIS — Z0289 Encounter for other administrative examinations: Secondary | ICD-10-CM

## 2014-06-28 LAB — CARBAMAZEPINE LEVEL, TOTAL: Carbamazepine Lvl: 9.3 ug/mL (ref 4.0–12.0)

## 2014-06-29 ENCOUNTER — Telehealth: Payer: Self-pay

## 2014-06-29 MED ORDER — CARBAMAZEPINE ER 100 MG PO CP12: 100.0000 mg | ORAL_CAPSULE | Freq: Every day | ORAL | Status: DC

## 2014-06-29 NOTE — Telephone Encounter (Signed)
Called patient back to inform him of NP-CM previous note. No answer. Will try later.

## 2014-06-29 NOTE — Telephone Encounter (Signed)
-----   Message from Dennie Bible, NP sent at 06/28/2014 12:29 PM EST ----- Carbamazepine level good. Please call patient

## 2014-06-29 NOTE — Telephone Encounter (Signed)
Spoke to patient. Gave lab results. Patient verbalized understanding. Patient states he was put on carbamazepine (CARBATROL) 100 MG 12 hr capsule 1 cap @ hs, for 30 days. He took his last one last night. Would like to know if he needs a refill or since lab results good, no need to take anymore.

## 2014-06-29 NOTE — Telephone Encounter (Signed)
Yes he needs to stay on 300mg  am and 400mg  pm which will include the extra 100mg  dose. I refilled for him That's why the labs were good.

## 2014-07-02 NOTE — Telephone Encounter (Signed)
Called patient. Left vmail.

## 2014-10-10 ENCOUNTER — Ambulatory Visit: Payer: Commercial Managed Care - PPO | Admitting: Nurse Practitioner

## 2014-10-11 ENCOUNTER — Encounter: Payer: Self-pay | Admitting: Nurse Practitioner

## 2014-12-10 ENCOUNTER — Ambulatory Visit (INDEPENDENT_AMBULATORY_CARE_PROVIDER_SITE_OTHER): Payer: Commercial Managed Care - PPO | Admitting: Nurse Practitioner

## 2014-12-10 ENCOUNTER — Encounter: Payer: Self-pay | Admitting: Nurse Practitioner

## 2014-12-10 VITALS — BP 129/90 | HR 71 | Ht 72.0 in | Wt 245.0 lb

## 2014-12-10 DIAGNOSIS — G40309 Generalized idiopathic epilepsy and epileptic syndromes, not intractable, without status epilepticus: Secondary | ICD-10-CM

## 2014-12-10 DIAGNOSIS — Z5181 Encounter for therapeutic drug level monitoring: Secondary | ICD-10-CM

## 2014-12-10 NOTE — Progress Notes (Signed)
I have read the note, and I agree with the clinical assessment and plan.  Abbigale Mcelhaney KEITH   

## 2014-12-10 NOTE — Progress Notes (Signed)
[facility]  PATIENT: Francisco [name] DOB: March 22, 1976   REASON FOR VISIT: Follow-up for seizure disorder HISTORY FROM: Patient    HISTORY OF PRESENT ILLNESS:Francisco [name], 39 year old male returns for followup.he was last seen in the office 05/24/2014. He has a history of seizure disorder onset in December 1991. Carbatrol was initiated in October 1999 and he has been well controlled on the drug since that time. Last seizure was in January 2014, he had missed a dose of his medication. When last seen  he  had 3-4 episodes of feeling like he was going to have seizure activity. Labs were drawn and his dose was increased by 100mg   at night. He has not had further episodes like he was going to have seizure activity. He denies missing any doses of his medication. He has had trials of Depakote and Dilantin with compliance issues and side effects. He denies any side effects to his Carbatrol, feelings of being off balance, no daytime drowsiness. He returns for reevaluation. He needs labs and refills.   REVIEW OF SYSTEMS: Full 14 system review of systems performed and notable only for those listed, all others are neg:  Constitutional: neg  Cardiovascular: neg Ear/Nose/Throat: neg  Skin: neg Eyes: neg Respiratory: neg Gastroitestinal: neg  Hematology/Lymphatic: neg  Endocrine: neg Musculoskeletal:neg Allergy/Immunology: neg Neurological: neg Psychiatric: neg Sleep : neg   ALLERGIES: No Known Allergies  HOME MEDICATIONS: Outpatient Prescriptions Prior to Visit  Medication Sig Dispense Refill  . carbamazepine (CARBATROL) 100 MG 12 hr capsule Take 1 capsule (100 mg total) by mouth at bedtime. Daily at hs 30 capsule 6  . carbamazepine (CARBATROL) 300 MG 12 hr capsule Take 1 capsule (300 mg total) by mouth 2 (two) times daily. 180 capsule 3  . Multiple Vitamins-Minerals (QC MENS DAILY MULTIVITAMIN PO) Take by mouth daily.    . pantoprazole (PROTONIX) 40 MG tablet Take 40 mg  by mouth daily.       No facility-administered medications prior to visit.    PAST MEDICAL HISTORY: Past Medical History  Diagnosis Date  . Seizures   . Acid reflux   . Migraine     PAST SURGICAL HISTORY: No past surgical history on file.  FAMILY HISTORY: No family history on file.  SOCIAL HISTORY: History   Social History  . Marital Status: Married    Spouse Name: [name]  . Number of Children: 3  . Years of Education: 12+   Occupational History  .  [employer]   Social History Main Topics  . Smoking status: Never Smoker   . Smokeless tobacco: Never Used  . Alcohol Use: No  . Drug Use: No  . Sexual Activity: Not on file   Other Topics Concern  . Not on file   Social History Narrative   Patient lives at home with wife Francisco [name].    Patient have 3 children.    Patient has some college.    Patient is currently working at [employer].    Patient is right handed.      PHYSICAL EXAM  Filed Vitals:   12/10/14 0825  BP: 129/90  Pulse: 71  Height: 6' (1.829 m)  Weight: 245 lb (111.131 kg)   Body mass index is 33.22 kg/(m^2). Generalized: Well developed, in no acute distress  Head: normocephalic and atraumatic,. Oropharynx benign  Neck: Supple, no carotid bruits  Musculoskeletal: No deformity   Neurological examination   Mentation: Alert oriented to time, place, history taking. Attention span and concentration  appropriate. Recent and remote memory intact. Follows all commands speech and language fluent.   Cranial nerve II-XII: Fundoscopic exam deferred Pupils were equal round reactive to light extraocular movements were full, visual field were full on confrontational test. Facial sensation and strength were normal. hearing was intact to finger rubbing bilaterally. Uvula tongue midline. head turning and shoulder shrug were normal and symmetric.Tongue protrusion into cheek strength was normal. Motor: normal bulk and tone, full strength in the BUE,  BLE, fine finger movements normal, no pronator drift. No focal weakness Coordination: finger-nose-finger, heel-to-shin bilaterally, no dysmetria Reflexes: Brachioradialis 2/2, biceps 2/2, triceps 2/2, patellar 2/2, Achilles 2/2, plantar responses were flexor bilaterally. Gait and Station: Rising up from seated position without assistance, normal stance, moderate stride, good arm swing, smooth turning, able to perform tiptoe, and heel walking without difficulty. Tandem gait is steady.  DIAGNOSTIC DATA (LABS, IMAGING, TESTING) - I reviewed patient records, labs, notes, testing and imaging myself where available.  Lab Results  Component Value Date   WBC 6.5 05/24/2014   HGB 16.0 05/24/2014   HCT 45.1 05/24/2014   MCV 93 05/24/2014   PLT 202 10/10/2012      Component Value Date/Time   NA 138 05/24/2014 1554   NA 137 03/16/2011 0105   K 4.5 05/24/2014 1554   CL 98 05/24/2014 1554   CO2 24 05/24/2014 1554   GLUCOSE 69 05/24/2014 1554   GLUCOSE 148* 03/16/2011 0105   BUN 14 05/24/2014 1554   BUN 26* 03/16/2011 0105   CREATININE 0.94 05/24/2014 1554   CALCIUM 9.3 05/24/2014 1554   PROT 7.4 05/24/2014 1554   PROT 7.9 03/16/2011 0105   ALBUMIN 4.3 03/16/2011 0105   AST 22 05/24/2014 1554   ALT 18 05/24/2014 1554   ALKPHOS 109 05/24/2014 1554   BILITOT 0.2 05/24/2014 1554   GFRNONAA 101 05/24/2014 1554   GFRAA 117 05/24/2014 1554    ASSESSMENT AND PLAN  39 y.o. year old male  has a past medical history of Seizures;  here to follow-up.  Continue Carbatrol at current dose will refill when labs are back Recheck CBZ level Call for any seizure activity Follow-up yearly  Dennie Bible, [facility], [facility], [facility] [address], [place],  [address] [phone]

## 2014-12-10 NOTE — Patient Instructions (Addendum)
Continue Carbatrol at current dose Recheck labs Call for any seizure activity Follow-up yearly

## 2014-12-11 ENCOUNTER — Telehealth: Payer: Self-pay | Admitting: *Deleted

## 2014-12-11 LAB — CARBAMAZEPINE LEVEL, TOTAL: Carbamazepine Lvl: 7.7 ug/mL (ref 4.0–12.0)

## 2014-12-11 NOTE — Telephone Encounter (Signed)
Pt no showed appt today

## 2014-12-11 NOTE — Telephone Encounter (Signed)
-----   Message from Dennie Bible, NP sent at 12/11/2014  7:54 AM EDT ----- Labs look good please call the patient

## 2014-12-11 NOTE — Telephone Encounter (Signed)
I called pt and relayed the results of his labs  (good).  He verbalized understanding.

## 2015-01-28 ENCOUNTER — Other Ambulatory Visit: Payer: Self-pay | Admitting: Nurse Practitioner

## 2015-02-28 ENCOUNTER — Other Ambulatory Visit: Payer: Self-pay

## 2015-02-28 MED ORDER — CARBAMAZEPINE ER 300 MG PO CP12: 300.0000 mg | ORAL_CAPSULE | Freq: Two times a day (BID) | ORAL | Status: DC

## 2015-03-05 ENCOUNTER — Telehealth: Payer: Self-pay | Admitting: Nurse Practitioner

## 2015-03-05 NOTE — Telephone Encounter (Signed)
I called again.  Spoke with [name].  They wanted to let us know they are no longer able to get the previous manufacturer of Carbatrol the patient has been taking,  They will need to dispense [employer] manufacturer instead.  The dose would remain the same.  They have contacted the patient as well regarding this.

## 2015-03-05 NOTE — Telephone Encounter (Signed)
I called back.  They were experiencing high call volume.  After holding for 20 minutes I hung up.  Will try again later.

## 2015-03-05 NOTE — Telephone Encounter (Signed)
Francisco [name] with [facility] requesting a specific manufacturing request for carbamazepine (CARBATROL) 300 MG 12 hr capsule . Please call and advise at [phone]. Ref# [id number].

## 2015-03-06 ENCOUNTER — Telehealth: Payer: Self-pay | Admitting: Nurse Practitioner

## 2015-03-06 MED ORDER — CARBAMAZEPINE ER 100 MG PO CP12: ORAL_CAPSULE | ORAL | Status: DC

## 2015-03-06 NOTE — Telephone Encounter (Signed)
Rx has been sent.  Receipt confirmed by pharmacy.   

## 2015-03-06 NOTE — Telephone Encounter (Signed)
Ann with [facility] is calling to get a new Rx for carbamazepine (CARBATROL) 100 MG 12 hr capsule for the patient. Thank you.

## 2015-08-20 ENCOUNTER — Other Ambulatory Visit: Payer: Self-pay | Admitting: Nurse Practitioner

## 2015-09-26 ENCOUNTER — Telehealth: Payer: Self-pay | Admitting: Nurse Practitioner

## 2015-09-26 MED ORDER — CARBAMAZEPINE ER 100 MG PO CP12: 100.0000 mg | ORAL_CAPSULE | Freq: Every day | ORAL | Status: DC

## 2015-09-26 NOTE — Telephone Encounter (Signed)
Patient is calling to get a written Rx for carbamazepine (CARBATROL) 100 MG 12 hr capsule. I advised the Rx will be ready in 24 hours unless the nurse advises otherwise.

## 2015-09-26 NOTE — Telephone Encounter (Signed)
I called and spoke to [facility], pharmacist and refilled the carbatrol 100mg  (12 hour)  Generic (which is what he received the last prescription).   (take 1 capsule po at bedtime).  # 90 with 1 refill.   [facility] .   This was sent in 08-20-15 but voided for some reason.  Redid as a no print as I called in.

## 2015-10-28 ENCOUNTER — Ambulatory Visit (HOSPITAL_COMMUNITY)
Admission: RE | Admit: 2015-10-28 | Discharge: 2015-10-28 | Disposition: A | Discharge status: Home / Self Care | Payer: Commercial Managed Care - PPO | Source: Ambulatory Visit | Attending: Family Medicine | Admitting: Family Medicine

## 2015-10-28 ENCOUNTER — Other Ambulatory Visit (HOSPITAL_COMMUNITY): Payer: Self-pay | Admitting: Family Medicine

## 2015-10-28 DIAGNOSIS — R1032 Left lower quadrant pain: Secondary | ICD-10-CM | POA: Diagnosis not present

## 2015-10-28 DIAGNOSIS — K5730 Diverticulosis of large intestine without perforation or abscess without bleeding: Secondary | ICD-10-CM | POA: Diagnosis not present

## 2015-10-28 MED ORDER — DIATRIZOATE MEGLUMINE & SODIUM 66-10 % PO SOLN
ORAL | Status: AC
  Filled 2015-10-28: qty 30

## 2015-10-28 MED ORDER — IOPAMIDOL (ISOVUE-300) INJECTION 61%
100.0000 mL | Freq: Once | INTRAVENOUS | Status: AC | PRN
  Administered 2015-10-28: 100 mL via INTRAVENOUS

## 2015-10-28 NOTE — Progress Notes (Signed)
Sent report via fax to Dr Hilma Favors at [phone]. I called his office twice, [phone], and never got an answer. Patient had gone home. I sent the report via fax once more. Unable to reach office. Report was diverticulosis without  Diverticulitis; no perforation or free air seen.

## 2015-11-25 ENCOUNTER — Encounter: Payer: Self-pay | Admitting: Internal Medicine

## 2015-12-10 ENCOUNTER — Ambulatory Visit: Payer: Commercial Managed Care - PPO | Admitting: Nurse Practitioner

## 2015-12-19 ENCOUNTER — Encounter: Payer: Self-pay | Admitting: Nurse Practitioner

## 2015-12-19 ENCOUNTER — Ambulatory Visit (INDEPENDENT_AMBULATORY_CARE_PROVIDER_SITE_OTHER): Payer: Commercial Managed Care - PPO | Admitting: Nurse Practitioner

## 2015-12-19 VITALS — BP 126/87 | HR 69 | Ht 72.0 in | Wt 236.0 lb

## 2015-12-19 DIAGNOSIS — G40309 Generalized idiopathic epilepsy and epileptic syndromes, not intractable, without status epilepticus: Secondary | ICD-10-CM | POA: Diagnosis not present

## 2015-12-19 DIAGNOSIS — Z5181 Encounter for therapeutic drug level monitoring: Secondary | ICD-10-CM | POA: Diagnosis not present

## 2015-12-19 MED ORDER — CARBAMAZEPINE ER 100 MG PO CP12: 100.0000 mg | ORAL_CAPSULE | Freq: Every day | ORAL | 3 refills | Status: DC

## 2015-12-19 MED ORDER — CARBAMAZEPINE ER 300 MG PO CP12: 300.0000 mg | ORAL_CAPSULE | Freq: Two times a day (BID) | ORAL | 0 refills | Status: DC

## 2015-12-19 NOTE — Progress Notes (Signed)
I have read the note, and I agree with the clinical assessment and plan.  WILLIS,CHARLES KEITH   

## 2015-12-19 NOTE — Patient Instructions (Signed)
Continue Carbatrol at current dose will refill  Check CBZ level, CBC CMP Call for any seizure activity Follow-up yearly   

## 2015-12-19 NOTE — Progress Notes (Signed)
[facility]  PATIENT: QUETZAL VERRIER DOB: [birth year]   REASON FOR VISIT: Follow-up for seizure disorder HISTORY FROM: Patient    HISTORY OF PRESENT ILLNESS:Mr. Lanctot, 40 year old male returns for yearly followup. He has a history of seizure disorder onset in December 1991. Carbatrol was initiated in October 1999 and he has been well controlled on the drug since that time. Last seizure was in January 2014, he had missed a dose of his medication.2 years ago   he  had 3-4 episodes of feeling like he was going to have seizure activity. Labs were drawn and his dose was increased by 100mg   at night. He has not had further episodes like he was going to have seizure activity. He denies missing any doses of his medication. He has had trials of Depakote and Dilantin with compliance issues and side effects. He denies any side effects to his Carbatrol, feelings of being off balance, no daytime drowsiness. He returns for reevaluation. He needs labs and refills.   REVIEW OF SYSTEMS: Full 14 system review of systems performed and notable only for those listed, all others are neg:  Constitutional: neg  Cardiovascular: neg Ear/Nose/Throat: neg  Skin: neg Eyes: neg Respiratory: neg Gastroitestinal: neg  Hematology/Lymphatic: neg  Endocrine: neg Musculoskeletal:neg Allergy/Immunology: neg Neurological: neg Psychiatric: neg Sleep : neg   ALLERGIES: No Known Allergies  HOME MEDICATIONS: Outpatient Medications Prior to Visit  Medication Sig Dispense Refill  . carbamazepine (CARBATROL) 100 MG 12 hr capsule Take 1 capsule (100 mg total) by mouth at bedtime. 90 capsule 1  . carbamazepine (CARBATROL) 300 MG 12 hr capsule Take 1 capsule (300 mg total) by mouth 2 (two) times daily. 180 capsule 2  . Multiple Vitamins-Minerals (QC MENS DAILY MULTIVITAMIN PO) Take by mouth daily.    . pantoprazole (PROTONIX) 40 MG tablet Take 40 mg by mouth daily.       No facility-administered  medications prior to visit.     PAST MEDICAL HISTORY: Past Medical History:  Diagnosis Date  . Acid reflux   . Migraine   . Seizures ([place])     PAST SURGICAL HISTORY: History reviewed. No pertinent surgical history.  FAMILY HISTORY: History reviewed. No pertinent family history.  SOCIAL HISTORY: Social History   Social History  . Marital status: Married    Spouse name: [name]  . Number of children: 3  . Years of education: 12+   Occupational History  .  [employer]   Social History Main Topics  . Smoking status: Never Smoker  . Smokeless tobacco: Never Used  . Alcohol use No  . Drug use: No  . Sexual activity: Not on file   Other Topics Concern  . Not on file   Social History Narrative   Patient lives at home with wife Carolos Harland.    Patient have 3 children.    Patient has some college.    Patient is currently working at [employer].    Patient is right handed.      PHYSICAL EXAM  Vitals:   12/19/15 0818  BP: 126/87  Pulse: 69  Weight: 236 lb (107 kg)  Height: 6' (1.829 m)   Body mass index is 32.01 kg/m. Generalized: Well developed, in no acute distress  Head: normocephalic and atraumatic,. Oropharynx benign  Neck: Supple, no carotid bruits  Musculoskeletal: No deformity   Neurological examination   Mentation: Alert oriented to time, place, history taking. Attention span and concentration appropriate. Recent and remote memory intact. Follows  all commands speech and language fluent.   Cranial nerve II-XII:  Pupils were equal round reactive to light extraocular movements were full, visual field were full on confrontational test. Facial sensation and strength were normal. hearing was intact to finger rubbing bilaterally. Uvula tongue midline. head turning and shoulder shrug were normal and symmetric.Tongue protrusion into cheek strength was normal. Motor: normal bulk and tone, full strength in the BUE, BLE, fine finger movements normal, no  pronator drift. No focal weakness Coordination: finger-nose-finger, heel-to-shin bilaterally, no dysmetria Reflexes: Brachioradialis 2/2, biceps 2/2, triceps 2/2, patellar 2/2, Achilles 2/2, plantar responses were flexor bilaterally. Gait and Station: Rising up from seated position without assistance, normal stance, moderate stride, good arm swing, smooth turning, able to perform tiptoe, and heel walking without difficulty. Tandem gait is steady.  DIAGNOSTIC DATA (LABS, IMAGING, TESTING) -  ASSESSMENT AND PLAN  40 y.o. year old male  has a past medical history of Seizures;  here to follow-up. Last seizure event 2014 after missed doses of medication.  Continue Carbatrol at current dose will refill  Check CBZ level, CBC CMP Call for any seizure activity Follow-up yearly  Dennie Bible, [facility], [facility], [facility] [address] [place], [place] [address] [phone]

## 2015-12-20 LAB — COMPREHENSIVE METABOLIC PANEL
ALK PHOS: 99 IU/L (ref 39–117)
ALT: 32 IU/L (ref 0–44)
AST: 26 IU/L (ref 0–40)
Albumin/Globulin Ratio: 1.7 (ref 1.2–2.2)
Albumin: 4.6 g/dL (ref 3.5–5.5)
BUN/Creatinine Ratio: 17 (ref 9–20)
BUN: 17 mg/dL (ref 6–24)
Bilirubin Total: 0.2 mg/dL (ref 0.0–1.2)
CHLORIDE: 102 mmol/L (ref 96–106)
CO2: 24 mmol/L (ref 18–29)
CREATININE: 1 mg/dL (ref 0.76–1.27)
Calcium: 9.6 mg/dL (ref 8.7–10.2)
GFR calc Af Amer: 108 mL/min/{1.73_m2} (ref >59)
GFR calc non Af Amer: 93 mL/min/{1.73_m2} (ref >59)
GLOBULIN, TOTAL: 2.7 g/dL (ref 1.5–4.5)
GLUCOSE: 104 mg/dL — AB (ref 65–99)
Potassium: 5.3 mmol/L — ABNORMAL HIGH (ref 3.5–5.2)
SODIUM: 142 mmol/L (ref 134–144)
Total Protein: 7.3 g/dL (ref 6.0–8.5)

## 2015-12-20 LAB — CBC WITH DIFFERENTIAL/PLATELET
BASOS: 1 %
Basophils Absolute: 0.1 10*3/uL (ref 0.0–0.2)
EOS (ABSOLUTE): 0.1 10*3/uL (ref 0.0–0.4)
Eos: 2 %
HEMATOCRIT: 45.6 % (ref 37.5–51.0)
Hemoglobin: 15.5 g/dL (ref 12.6–17.7)
IMMATURE GRANS (ABS): 0 10*3/uL (ref 0.0–0.1)
IMMATURE GRANULOCYTES: 0 %
Lymphocytes Absolute: 1.8 10*3/uL (ref 0.7–3.1)
Lymphs: 28 %
MCH: 32.4 pg (ref 26.6–33.0)
MCHC: 34 g/dL (ref 31.5–35.7)
MCV: 95 fL (ref 79–97)
MONOCYTES: 9 %
Monocytes Absolute: 0.5 10*3/uL (ref 0.1–0.9)
NEUTROS ABS: 3.9 10*3/uL (ref 1.4–7.0)
Neutrophils: 60 %
Platelets: 178 10*3/uL (ref 150–379)
RBC: 4.78 x10E6/uL (ref 4.14–5.80)
RDW: 13.9 % (ref 12.3–15.4)
WBC: 6.4 10*3/uL (ref 3.4–10.8)

## 2015-12-20 LAB — CARBAMAZEPINE LEVEL, TOTAL: CARBAMAZEPINE LVL: 8.8 ug/mL (ref 4.0–12.0)

## 2015-12-23 ENCOUNTER — Telehealth: Payer: Self-pay | Admitting: *Deleted

## 2015-12-23 NOTE — Telephone Encounter (Signed)
Spoke to pt and relayed that lab results stable.  He verbalized understanding.

## 2015-12-23 NOTE — Telephone Encounter (Signed)
-----   Message from Dennie Bible, NP sent at 12/23/2015  8:33 AM EDT ----- Labs are stable please call the patient

## 2015-12-25 ENCOUNTER — Ambulatory Visit (INDEPENDENT_AMBULATORY_CARE_PROVIDER_SITE_OTHER): Payer: Commercial Managed Care - PPO | Admitting: Nurse Practitioner

## 2015-12-25 ENCOUNTER — Encounter: Payer: Self-pay | Admitting: Nurse Practitioner

## 2015-12-25 DIAGNOSIS — K5732 Diverticulitis of large intestine without perforation or abscess without bleeding: Secondary | ICD-10-CM

## 2015-12-25 NOTE — Assessment & Plan Note (Signed)
Patient diagnosed with diverticulitis in June on CT imaging with associated abdominal pain. Completed outpatient course of antibiotics. Has had significant improvement, currently asymptomatic. Had a lengthy discussion on diverticula, diverticulitis, and risk for diverticular bleed. He is to inform a provider if he has worsening pain, notable GI bleed. No family history of colon cancer. Current recommendations recommend a colonoscopy on all patients after the first attack of diverticulitis with resolution of symptoms who are asymptomatic 68 weeks out from acute onset. We will proceed with colonoscopy to further evaluate extent it is diverticulitis and check for any underlying colon cancer.  Proceed with TCS with Dr. Gala Romney in near future: the risks, benefits, and alternatives have been discussed with the patient in detail. The patient states understanding and desires to proceed.  Patient is not on any anticoagulants, anxiolytics, chronic pain medications, or antidepressants. Denies drug and alcohol use. He is of younger age at 40 years old and we'll provide for 25 mg procedure Phenergan to promote adequate sedation.

## 2015-12-25 NOTE — Progress Notes (Signed)
Primary Care Physician:  Purvis Kilts, MD Primary Gastroenterologist:  Dr. Gala Romney  Chief Complaint  Patient presents with  . Diverticulitis    doing ok    HPI:   Francisco [name] is a 40 y.o. male who presents on referral from primary care for diverticulitis. Patient was last seen by his PCP 10/28/2015 for left-sided abdominal pain. The pain started 2 days prior to presentation with severe, sudden, sharp in the left lower quadrant. Stat CT abdomen and pelvis with contrast was ordered. CT was completed on 10/28/2015 and found normal liver and gallbladder, normal pancreas, normal spleen, normal appendix. Diverticulosis of descending and sigmoid colon with colon wall thickening and pericolic infiltrative changes and proximal sigmoid colon consistent with acute diverticulitis. No evidence of perforation or abscess. No notes from follow-up visit indicated treatment, presume he was treated with antibiotics.  Today he states he's doing well overall. He was treated with antibiotics on an outpatient basis. He was treated with two antibiotics, cannot remember the name (likely Cipro and Flagyl). He completed all of these. States his symptoms improved significantly when starting antibiotics. Currently not having any abdominal pain, N/V, fever, chills, unintentional weight loss. Denies chest pain, dyspnea, dizziness, lightheadedness, syncope, near syncope. Denies any other upper or lower GI symptoms.  Past Medical History:  Diagnosis Date  . Acid reflux   . Migraine   . Seizures ([place])     No past surgical history on file.  Current Outpatient Prescriptions  Medication Sig Dispense Refill  . carbamazepine (CARBATROL) 100 MG 12 hr capsule Take 1 capsule (100 mg total) by mouth at bedtime. 90 capsule 3  . carbamazepine (CARBATROL) 300 MG 12 hr capsule Take 1 capsule (300 mg total) by mouth 2 (two) times daily. 60 capsule 0  . Multiple Vitamins-Minerals (QC MENS DAILY MULTIVITAMIN PO) Take by mouth  daily.    . pantoprazole (PROTONIX) 40 MG tablet Take 40 mg by mouth daily.       No current facility-administered medications for this visit.     Allergies as of 12/25/2015  . (No Known Allergies)    No family history on file.  Social History   Social History  . Marital status: Married    Spouse name: Francisco [name]  . Number of children: 3  . Years of education: 12+   Occupational History  .  [employer]   Social History Main Topics  . Smoking status: Never Smoker  . Smokeless tobacco: Never Used  . Alcohol use No  . Drug use: No  . Sexual activity: Not on file   Other Topics Concern  . Not on file   Social History Narrative   Patient lives at home with wife Francisco [name].    Patient have 3 children.    Patient has some college.    Patient is currently working at [employer].    Patient is right handed.     Review of Systems: General: Negative for anorexia, weight loss, fever, chills, fatigue, weakness. ENT: Negative for hoarseness, difficulty swallowing. CV: Negative for chest pain, angina, palpitations, peripheral edema.  Respiratory: Negative for dyspnea at rest, cough, sputum, wheezing.  GI: See history of present illness. MS: Negative for joint pain, low back pain.  Derm: Negative for rash or itching.  Endo: Negative for unusual weight change.  Heme: Negative for bruising or bleeding. Allergy: Negative for rash or hives.    Physical Exam: BP 128/80   Pulse 89   Temp 98.6 F (37  C) (Oral)   Ht 6' (1.829 m)   Wt 235 lb (106.6 kg)   BMI 31.87 kg/m  General:   Alert and oriented. Pleasant and cooperative. Well-nourished and well-developed.  Head:  Normocephalic and atraumatic. Eyes:  Without icterus, sclera clear and conjunctiva pink.  Ears:  Normal auditory acuity. Cardiovascular:  S1, S2 present without murmurs appreciated. Normal pulses noted. Extremities without clubbing or edema. Respiratory:  Clear to auscultation bilaterally. No wheezes, rales,  or rhonchi. No distress.  Gastrointestinal:  +BS, rounded but soft, non-tender and non-distended. No HSM noted. No guarding or rebound. No masses appreciated. Rectal:  Deferred  Neurologic:  Alert and oriented x4;  grossly normal neurologically. Psych:  Alert and cooperative. Normal mood and affect. Heme/Lymph/Immune: No excessive bruising noted.    12/25/2015 4:31 PM   Disclaimer: This note was dictated with voice recognition software. Similar sounding words can inadvertently be transcribed and may not be corrected upon review.

## 2015-12-25 NOTE — Patient Instructions (Signed)
1. Schedule procedure for you. 2. Return for follow-up based on recommendations made after your procedure.

## 2015-12-26 ENCOUNTER — Other Ambulatory Visit: Payer: Self-pay

## 2015-12-26 DIAGNOSIS — K5732 Diverticulitis of large intestine without perforation or abscess without bleeding: Secondary | ICD-10-CM

## 2015-12-26 MED ORDER — NA SULFATE-K SULFATE-MG SULF 17.5-3.13-1.6 GM/177ML PO SOLN: 1.0000 | ORAL | 0 refills | Status: DC

## 2015-12-26 NOTE — Progress Notes (Signed)
cc'ed to pcp °

## 2016-01-06 ENCOUNTER — Other Ambulatory Visit: Payer: Self-pay | Admitting: Nurse Practitioner

## 2016-01-07 ENCOUNTER — Telehealth: Payer: Self-pay | Admitting: Nurse Practitioner

## 2016-01-07 NOTE — Telephone Encounter (Signed)
Francisco [name] called regarding CARBAMAZEPINE prescription, was advised by [facility] to contact our office regarding authorization for this medication.

## 2016-01-08 ENCOUNTER — Ambulatory Visit (HOSPITAL_COMMUNITY)
Admission: RE | Admit: 2016-01-08 | Discharge: 2016-01-08 | Disposition: A | Discharge status: Home / Self Care | Payer: Commercial Managed Care - PPO | Source: Ambulatory Visit | Attending: Internal Medicine | Admitting: Internal Medicine

## 2016-01-08 ENCOUNTER — Encounter (HOSPITAL_COMMUNITY): Payer: Self-pay | Admitting: *Deleted

## 2016-01-08 ENCOUNTER — Encounter (HOSPITAL_COMMUNITY)
Admission: RE | Disposition: A | Discharge status: Home / Self Care | Payer: Self-pay | Source: Ambulatory Visit | Attending: Internal Medicine

## 2016-01-08 DIAGNOSIS — K5730 Diverticulosis of large intestine without perforation or abscess without bleeding: Secondary | ICD-10-CM

## 2016-01-08 DIAGNOSIS — R933 Abnormal findings on diagnostic imaging of other parts of digestive tract: Secondary | ICD-10-CM | POA: Diagnosis not present

## 2016-01-08 DIAGNOSIS — Z79899 Other long term (current) drug therapy: Secondary | ICD-10-CM | POA: Diagnosis not present

## 2016-01-08 DIAGNOSIS — D120 Benign neoplasm of cecum: Secondary | ICD-10-CM

## 2016-01-08 DIAGNOSIS — K219 Gastro-esophageal reflux disease without esophagitis: Secondary | ICD-10-CM | POA: Diagnosis not present

## 2016-01-08 DIAGNOSIS — K5732 Diverticulitis of large intestine without perforation or abscess without bleeding: Secondary | ICD-10-CM

## 2016-01-08 DIAGNOSIS — D124 Benign neoplasm of descending colon: Secondary | ICD-10-CM | POA: Insufficient documentation

## 2016-01-08 HISTORY — PX: COLONOSCOPY: SHX5424

## 2016-01-08 SURGERY — COLONOSCOPY
Anesthesia: Moderate Sedation

## 2016-01-08 MED ORDER — ONDANSETRON HCL 4 MG/2ML IJ SOLN
INTRAMUSCULAR | Status: DC | PRN
  Administered 2016-01-08: 4 mg via INTRAVENOUS

## 2016-01-08 MED ORDER — ONDANSETRON HCL 4 MG/2ML IJ SOLN
INTRAMUSCULAR | Status: AC
  Filled 2016-01-08: qty 2

## 2016-01-08 MED ORDER — CARBAMAZEPINE ER 300 MG PO CP12: 300.0000 mg | ORAL_CAPSULE | Freq: Two times a day (BID) | ORAL | 3 refills | Status: DC

## 2016-01-08 MED ORDER — MEPERIDINE HCL 100 MG/ML IJ SOLN
INTRAMUSCULAR | Status: DC | PRN
  Administered 2016-01-08: 50 mg via INTRAVENOUS

## 2016-01-08 MED ORDER — SIMETHICONE 40 MG/0.6ML PO SUSP
ORAL | Status: DC | PRN
  Administered 2016-01-08: 11:00:00

## 2016-01-08 MED ORDER — MIDAZOLAM HCL 5 MG/5ML IJ SOLN
INTRAMUSCULAR | Status: AC
  Filled 2016-01-08: qty 10

## 2016-01-08 MED ORDER — PROMETHAZINE HCL 25 MG/ML IJ SOLN
INTRAMUSCULAR | Status: AC
  Administered 2016-01-08: 25 mg via INTRAVENOUS
  Filled 2016-01-08: qty 1

## 2016-01-08 MED ORDER — PROMETHAZINE HCL 25 MG/ML IJ SOLN
25.0000 mg | Freq: Once | INTRAMUSCULAR | Status: AC
  Administered 2016-01-08: 25 mg via INTRAVENOUS

## 2016-01-08 MED ORDER — SODIUM CHLORIDE 0.9% FLUSH
INTRAVENOUS | Status: AC
  Filled 2016-01-08: qty 10

## 2016-01-08 MED ORDER — SODIUM CHLORIDE 0.9 % IV SOLN
INTRAVENOUS | Status: DC
  Administered 2016-01-08: 10:00:00 via INTRAVENOUS

## 2016-01-08 MED ORDER — MEPERIDINE HCL 100 MG/ML IJ SOLN
INTRAMUSCULAR | Status: AC
  Filled 2016-01-08: qty 2

## 2016-01-08 MED ORDER — MIDAZOLAM HCL 5 MG/5ML IJ SOLN
INTRAMUSCULAR | Status: DC | PRN
  Administered 2016-01-08: 1 mg via INTRAVENOUS
  Administered 2016-01-08: 2 mg via INTRAVENOUS
  Administered 2016-01-08: 1 mg via INTRAVENOUS

## 2016-01-08 NOTE — Op Note (Signed)
[facility] Patient Name: Francisco [name] Procedure Date: 01/08/2016 10:20 AM MRN: [id number] Date of Birth: [birth year] Attending MD: Norvel Richards , MD CSN: [id number] Age: 40 Admit Type: Outpatient Procedure:                Colonoscopy with multiple snare polypectomies Indications:              Abnormal CT of the GI tract Providers:                Norvel Richards, MD, Janeece Riggers, RN, Randa Spike, Technician Referring MD:              Medicines:                Midazolam 4 mg IV, Meperidine 50 mg IV,                            Promethazine 25 mg IV, Ondansetron 4 mg IV Complications:            No immediate complications. Estimated Blood Loss:     Estimated blood loss was minimal. Procedure:                Pre-Anesthesia Assessment:                           - Prior to the procedure, a History and Physical                            was performed, and patient medications and                            allergies were reviewed. The patient's tolerance of                            previous anesthesia was also reviewed. The risks                            and benefits of the procedure and the sedation                            options and risks were discussed with the patient.                            All questions were answered, and informed consent                            was obtained. Prior Anticoagulants: The patient has                            taken no previous anticoagulant or antiplatelet                            agents. ASA Grade Assessment: II - A patient with  mild systemic disease. After reviewing the risks                            and benefits, the patient was deemed in                            satisfactory condition to undergo the procedure.                           After obtaining informed consent, the colonoscope                            was passed under direct vision. Throughout the                             procedure, the patient's blood pressure, pulse, and                            oxygen saturations were monitored continuously. The                            [id number] scope was introduced through                            the anus and advanced to the the cecum, identified                            by appendiceal orifice and ileocecal valve. The                            ileocecal valve, appendiceal orifice, and rectum                            were photographed. The entire colon was well                            visualized. The colonoscopy was performed without                            difficulty. The quality of the bowel preparation                            was adequate. Scope In: 10:34:02 AM Scope Out: 10:47:57 AM Scope Withdrawal Time: 0 hours 12 minutes 16 seconds  Total Procedure Duration: 0 hours 13 minutes 55 seconds  Findings:      The perianal and digital rectal examinations were normal.      Scattered small-mouthed diverticula were found in the sigmoid colon and       mid sigmoid colon. Slightly edematous mucosa through this segment with       some hyperpigmentation. No evidence of neoplasm.      (1) 3 mm polyp was found in the cecum. The polyp was semi-pedunculated.       The polyp was removed with a cold snare. Resection and retrieval were  complete. Estimated blood loss was minimal.      (1) 3 mm polyp was found in the descending colon. The polyp was sessile.       The polyp was removed with a cold snare. Resection and retrieval were       complete. Estimated blood loss was minimal.      The exam was otherwise without abnormality on direct and retroflexion       views. Impression:               - Diverticulosis in the sigmoid colon and in the                            mid sigmoid colon with resolving sigmoid                            diverticulitis.                           - One 3 mm polyp in the cecum, removed with  a cold                            snare. Resected and retrieved.                           - One 3 mm polyp in the descending colon, removed                            with a cold snare. Resected and retrieved.                           - The examination was otherwise normal on direct                            and retroflexion views. Moderate Sedation:      Moderate (conscious) sedation was administered by the endoscopy nurse       and supervised by the endoscopist. The following parameters were       monitored: oxygen saturation, heart rate, blood pressure, respiratory       rate, EKG, adequacy of pulmonary ventilation, and response to care.       Total physician intraservice time was 17 minutes. Recommendation:           - Patient has a contact number available for                            emergencies. The signs and symptoms of potential                            delayed complications were discussed with the                            patient. Return to normal activities tomorrow.                            Written discharge instructions were provided to the  patient.                           - Advance diet as tolerated. Benefiber 1 tablespoon                            twice daily                           - Continue present medications.                           - Repeat colonoscopy date to be determined after                            pending pathology results are reviewed for                            surveillance based on pathology results.                           - Return to GI office (date not yet determined). Procedure Code(s):        --- Professional ---                           [phone], Colonoscopy, flexible; with removal of                            tumor(s), polyp(s), or other lesion(s) by snare                            technique                           99152, Moderate sedation services provided by the                            same  physician or other qualified health care                            professional performing the diagnostic or                            therapeutic service that the sedation supports,                            requiring the presence of an independent trained                            observer to assist in the monitoring of the                            patient's level of consciousness and physiological                            status; initial 15 minutes  of intraservice time,                            patient age 21 years or older Diagnosis Code(s):        --- Professional ---                           D12.0, Benign neoplasm of cecum                           D12.4, Benign neoplasm of descending colon                           K57.30, Diverticulosis of large intestine without                            perforation or abscess without bleeding                           R93.3, Abnormal findings on diagnostic imaging of                            other parts of digestive tract CPT copyright 2016 American Medical Association. All rights reserved. The codes documented in this report are preliminary and upon coder review may  be revised to meet current compliance requirements. Cristopher Estimable. Darshana Curnutt, MD Norvel Richards, MD 01/08/2016 10:58:45 AM This report has been signed electronically. Number of Addenda: 0

## 2016-01-08 NOTE — Interval H&P Note (Signed)
History and Physical Interval Note:  01/08/2016 10:20 AM  [name]  has presented today for surgery, with the diagnosis of DIVERTICULITIS  The various methods of treatment have been discussed with the patient and family. After consideration of risks, benefits and other options for treatment, the patient has consented to  Procedure(s) with comments: COLONOSCOPY (N/A) - 1030  as a surgical intervention .  The patient's history has been reviewed, patient examined, no change in status, stable for surgery.  I have reviewed the patient's chart and labs.  Questions were answered to the patient's satisfaction.     Olga Bourbeau  No change. Diagnostic colonoscopy per plan.  The risks, benefits, limitations, alternatives and imponderables have been reviewed with the patient. Questions have been answered. All parties are agreeable.

## 2016-01-08 NOTE — Discharge Instructions (Addendum)
Colonoscopy Discharge Instructions  Read the instructions outlined below and refer to this sheet in the next few weeks. These discharge instructions provide you with general information on caring for yourself after you leave the hospital. Your doctor may also give you specific instructions. While your treatment has been planned according to the most current medical practices available, unavoidable complications occasionally occur. If you have any problems or questions after discharge, call Dr. Gala Romney at [phone]. After hours, call the main hospital number and ask to have the GI doctor on call to be paged ACTIVITY  You may resume your regular activity, but move at a slower pace for the next 24 hours.   Take frequent rest periods for the next 24 hours.   Walking will help get rid of the air and reduce the bloated feeling in your belly (abdomen).   No driving for 24 hours (because of the medicine (anesthesia) used during the test).    Do not sign any important legal documents or operate any machinery for 24 hours (because of the anesthesia used during the test).  NUTRITION  Drink plenty of fluids.   You may resume your normal diet as instructed by your doctor.   Begin with a light meal and progress to your normal diet. Heavy or fried foods are harder to digest and may make you feel sick to your stomach (nauseated).   Avoid alcoholic beverages for 24 hours or as instructed.  MEDICATIONS  You may resume your normal medications unless your doctor tells you otherwise.  WHAT YOU CAN EXPECT TODAY  Some feelings of bloating in the abdomen.   Passage of more gas than usual.   Spotting of blood in your stool or on the toilet paper.  FINDING OUT THE RESULTS OF YOUR TEST Not all test results are available during your visit. If your test results are not back during the visit, make an appointment with your caregiver to find out the results. Do not assume everything is normal if you have not  heard from your caregiver or the medical facility. It is important for you to follow up on all of your test results.  SEEK IMMEDIATE MEDICAL ATTENTION IF:  You have more than a spotting of blood in your stool.   Your belly is swollen (abdominal distention).   You are nauseated or vomiting.   You have a temperature over 101.   You have abdominal pain or discomfort that is severe or gets worse throughout the day.     Polyp and Diverticulosis Information Provided  Take Benefiber 1 Tablespoon Twice Daily  Further Recommendations to Follow Pending Review of Pathology Report  Colon Polyps Polyps are lumps of extra tissue growing inside the body. Polyps can grow in the large intestine (colon). Most colon polyps are noncancerous (benign). However, some colon polyps can become cancerous over time. Polyps that are larger than a pea may be harmful. To be safe, caregivers remove and test all polyps. CAUSES  Polyps form when mutations in the genes cause your cells to grow and divide even though no more tissue is needed. RISK FACTORS There are a number of risk factors that can increase your chances of getting colon polyps. They include:  Being older than 50 years.  Family history of colon polyps or colon cancer.  Long-term colon diseases, such as colitis or Crohn disease.  Being overweight.  Smoking.  Being inactive.  Drinking too much alcohol. SYMPTOMS  Most small polyps do not cause symptoms. If symptoms are  present, they may include:  Blood in the stool. The stool may look dark red or black.  Constipation or diarrhea that lasts longer than 1 week. DIAGNOSIS People often do not know they have polyps until their caregiver finds them during a regular checkup. Your caregiver can use 4 tests to check for polyps:  Digital rectal exam. The caregiver wears gloves and feels inside the rectum. This test would find polyps only in the rectum.  Barium enema. The caregiver puts a liquid  called barium into your rectum before taking X-rays of your colon. Barium makes your colon look white. Polyps are dark, so they are easy to see in the X-ray pictures.  Sigmoidoscopy. A thin, flexible tube (sigmoidoscope) is placed into your rectum. The sigmoidoscope has a light and tiny camera in it. The caregiver uses the sigmoidoscope to look at the last third of your colon.  Colonoscopy. This test is like sigmoidoscopy, but the caregiver looks at the entire colon. This is the most common method for finding and removing polyps. TREATMENT  Any polyps will be removed during a sigmoidoscopy or colonoscopy. The polyps are then tested for cancer. PREVENTION  To help lower your risk of getting more colon polyps:  Eat plenty of fruits and vegetables. Avoid eating fatty foods.  Do not smoke.  Avoid drinking alcohol.  Exercise every day.  Lose weight if recommended by your caregiver.  Eat plenty of calcium and folate. Foods that are rich in calcium include milk, cheese, and broccoli. Foods that are rich in folate include chickpeas, kidney beans, and spinach. HOME CARE INSTRUCTIONS Keep all follow-up appointments as directed by your caregiver. You may need periodic exams to check for polyps. SEEK MEDICAL CARE IF: You notice bleeding during a bowel movement.   This information is not intended to replace advice given to you by your health care provider. Make sure you discuss any questions you have with your health care provider.   Document Released: 01/22/2004 Document Revised: 05/18/2014 Document Reviewed: 07/07/2011 Elsevier Interactive Patient Education 2016 [employer].   Diverticulosis Diverticulosis is the condition that develops when small pouches (diverticula) form in the wall of your colon. Your colon, or large intestine, is where water is absorbed and stool is formed. The pouches form when the inside layer of your colon pushes through weak spots in the outer layers of your  colon. CAUSES  No one knows exactly what causes diverticulosis. RISK FACTORS  Being older than 37. Your risk for this condition increases with age. Diverticulosis is rare in people younger than 40 years. By age 57, almost everyone has it.  Eating a low-fiber diet.  Being frequently constipated.  Being overweight.  Not getting enough exercise.  Smoking.  Taking over-the-counter pain medicines, like aspirin and ibuprofen. SYMPTOMS  Most people with diverticulosis do not have symptoms. DIAGNOSIS  Because diverticulosis often has no symptoms, health care providers often discover the condition during an exam for other colon problems. In many cases, a health care provider will diagnose diverticulosis while using a flexible scope to examine the colon (colonoscopy). TREATMENT  If you have never developed an infection related to diverticulosis, you may not need treatment. If you have had an infection before, treatment may include:  Eating more fruits, vegetables, and grains.  Taking a fiber supplement.  Taking a live bacteria supplement (probiotic).  Taking medicine to relax your colon. HOME CARE INSTRUCTIONS   Drink at least 6-8 glasses of water each day to prevent constipation.  Try not  to strain when you have a bowel movement.  Keep all follow-up appointments. If you have had an infection before:  Increase the fiber in your diet as directed by your health care provider or dietitian.  Take a dietary fiber supplement if your health care provider approves.  Only take medicines as directed by your health care provider. SEEK MEDICAL CARE IF:   You have abdominal pain.  You have bloating.  You have cramps.  You have not gone to the bathroom in 3 days. SEEK IMMEDIATE MEDICAL CARE IF:   Your pain gets worse.  Yourbloating becomes very bad.  You have a fever or chills, and your symptoms suddenly get worse.  You begin vomiting.  You have bowel movements that are  bloody or black. MAKE SURE YOU:  Understand these instructions.  Will watch your condition.  Will get help right away if you are not doing well or get worse.   This information is not intended to replace advice given to you by your health care provider. Make sure you discuss any questions you have with your health care provider.   Document Released: 01/23/2004 Document Revised: 05/02/2013 Document Reviewed: 03/22/2013 Elsevier Interactive Patient Education [employer].

## 2016-01-08 NOTE — H&P (View-Only) (Signed)
Primary Care Physician:  Purvis Kilts, MD Primary Gastroenterologist:  Dr. Gala Romney  Chief Complaint  Patient presents with  . Diverticulitis    doing ok    HPI:   Francisco [name] is a 40 y.o. male who presents on referral from primary care for diverticulitis. Patient was last seen by his PCP 10/28/2015 for left-sided abdominal pain. The pain started 2 days prior to presentation with severe, sudden, sharp in the left lower quadrant. Stat CT abdomen and pelvis with contrast was ordered. CT was completed on 10/28/2015 and found normal liver and gallbladder, normal pancreas, normal spleen, normal appendix. Diverticulosis of descending and sigmoid colon with colon wall thickening and pericolic infiltrative changes and proximal sigmoid colon consistent with acute diverticulitis. No evidence of perforation or abscess. No notes from follow-up visit indicated treatment, presume he was treated with antibiotics.  Today he states he's doing well overall. He was treated with antibiotics on an outpatient basis. He was treated with two antibiotics, cannot remember the name (likely Cipro and Flagyl). He completed all of these. States his symptoms improved significantly when starting antibiotics. Currently not having any abdominal pain, N/V, fever, chills, unintentional weight loss. Denies chest pain, dyspnea, dizziness, lightheadedness, syncope, near syncope. Denies any other upper or lower GI symptoms.  Past Medical History:  Diagnosis Date  . Acid reflux   . Migraine   . Seizures ([place])     No past surgical history on file.  Current Outpatient Prescriptions  Medication Sig Dispense Refill  . carbamazepine (CARBATROL) 100 MG 12 hr capsule Take 1 capsule (100 mg total) by mouth at bedtime. 90 capsule 3  . carbamazepine (CARBATROL) 300 MG 12 hr capsule Take 1 capsule (300 mg total) by mouth 2 (two) times daily. 60 capsule 0  . Multiple Vitamins-Minerals (QC MENS DAILY MULTIVITAMIN PO) Take by mouth  daily.    . pantoprazole (PROTONIX) 40 MG tablet Take 40 mg by mouth daily.       No current facility-administered medications for this visit.     Allergies as of 12/25/2015  . (No Known Allergies)    No family history on file.  Social History   Social History  . Marital status: Married    Spouse name: [name]  . Number of children: 3  . Years of education: 12+   Occupational History  .  [employer]   Social History Main Topics  . Smoking status: Never Smoker  . Smokeless tobacco: Never Used  . Alcohol use No  . Drug use: No  . Sexual activity: Not on file   Other Topics Concern  . Not on file   Social History Narrative   Patient lives at home with wife Sebastion Sage.    Patient have 3 children.    Patient has some college.    Patient is currently working at [employer].    Patient is right handed.     Review of Systems: General: Negative for anorexia, weight loss, fever, chills, fatigue, weakness. ENT: Negative for hoarseness, difficulty swallowing. CV: Negative for chest pain, angina, palpitations, peripheral edema.  Respiratory: Negative for dyspnea at rest, cough, sputum, wheezing.  GI: See history of present illness. MS: Negative for joint pain, low back pain.  Derm: Negative for rash or itching.  Endo: Negative for unusual weight change.  Heme: Negative for bruising or bleeding. Allergy: Negative for rash or hives.    Physical Exam: BP 128/80   Pulse 89   Temp 98.6 F (37  C) (Oral)   Ht 6' (1.829 m)   Wt 235 lb (106.6 kg)   BMI 31.87 kg/m  General:   Alert and oriented. Pleasant and cooperative. Well-nourished and well-developed.  Head:  Normocephalic and atraumatic. Eyes:  Without icterus, sclera clear and conjunctiva pink.  Ears:  Normal auditory acuity. Cardiovascular:  S1, S2 present without murmurs appreciated. Normal pulses noted. Extremities without clubbing or edema. Respiratory:  Clear to auscultation bilaterally. No wheezes, rales,  or rhonchi. No distress.  Gastrointestinal:  +BS, rounded but soft, non-tender and non-distended. No HSM noted. No guarding or rebound. No masses appreciated. Rectal:  Deferred  Neurologic:  Alert and oriented x4;  grossly normal neurologically. Psych:  Alert and cooperative. Normal mood and affect. Heme/Lymph/Immune: No excessive bruising noted.    12/25/2015 4:31 PM   Disclaimer: This note was dictated with voice recognition software. Similar sounding words can inadvertently be transcribed and may not be corrected upon review.

## 2016-01-08 NOTE — Telephone Encounter (Signed)
Spoke to wife.  Needed prescription for carbamazepine 300mg  po bid [facility].  Done.

## 2016-01-09 ENCOUNTER — Encounter: Payer: Self-pay | Admitting: Internal Medicine

## 2016-01-14 ENCOUNTER — Encounter (HOSPITAL_COMMUNITY): Payer: Self-pay | Admitting: Internal Medicine

## 2016-01-15 ENCOUNTER — Other Ambulatory Visit: Payer: Self-pay | Admitting: Nurse Practitioner

## 2016-01-16 NOTE — Telephone Encounter (Signed)
Spoke with Alyse Low, [place] and advised her that the carbamazepine 300 mg tab refill for one year was sent on 01/08/16 to [facility]. She stated she would call the patient to clarify if he is now using [facility] for this medication. She verbalized understanding of call.

## 2016-01-19 ENCOUNTER — Other Ambulatory Visit: Payer: Self-pay | Admitting: Nurse Practitioner

## 2016-01-20 NOTE — Telephone Encounter (Addendum)
Awaiting order from [facility], due out on 01-22-16 and will be 7-10 days. Pt needs meds to cover until receives. Pt aware.

## 2016-03-30 ENCOUNTER — Telehealth: Payer: Self-pay | Admitting: Nurse Practitioner

## 2016-03-30 MED ORDER — CARBAMAZEPINE ER 100 MG PO CP12: 100.0000 mg | ORAL_CAPSULE | Freq: Every day | ORAL | 0 refills | Status: DC

## 2016-03-30 NOTE — Addendum Note (Signed)
Addended by: Oliver Hum on: 03/30/2016 04:00 PM   Modules accepted: Orders

## 2016-03-30 NOTE — Telephone Encounter (Signed)
Patient requesting refill of carbamazepine (CARBATROL) 100 MG 12 hr capsule one month supply. [facility] [address]

## 2016-03-30 NOTE — Telephone Encounter (Signed)
Done for one month. (Carbatrol 100mg  at bedtime) #30. No rf.

## 2016-04-22 ENCOUNTER — Other Ambulatory Visit: Payer: Self-pay | Admitting: Nurse Practitioner

## 2016-04-22 NOTE — Telephone Encounter (Signed)
Patient's wiferequesting refill of carbamazepine (CARBATROL) 100 MG 12 hr capsule #90 called to [facility] for the patient.

## 2016-04-22 NOTE — Telephone Encounter (Signed)
Spoke with wife and advised her that the prescription was renewed to [facility] on 12/19/15 for one full year. She stated she would call [facility] to check on getting a refill. She verbalized understanding, appreciation of call.

## 2016-05-01 ENCOUNTER — Other Ambulatory Visit: Payer: Self-pay | Admitting: Nurse Practitioner

## 2016-05-15 ENCOUNTER — Telehealth: Payer: Self-pay | Admitting: Nurse Practitioner

## 2016-05-21 MED ORDER — CARBAMAZEPINE ER 100 MG PO CP12: 100.0000 mg | ORAL_CAPSULE | Freq: Every day | ORAL | 2 refills | Status: DC

## 2016-05-21 NOTE — Addendum Note (Signed)
Addended byOliver Hum on: 05/21/2016 04:58 PM   Modules accepted: Orders

## 2016-05-21 NOTE — Telephone Encounter (Signed)
Patient's wife requesting refill of carbamazepine (CARBATROL) 100 MG 12 hr capsule. I spoke to patient's wife with [facility] on the line. [facility] states they do not have any refills for the patient.

## 2016-05-21 NOTE — Telephone Encounter (Signed)
[facility] carb[facility]rol 100mg  po qhs.

## 2016-05-22 ENCOUNTER — Other Ambulatory Visit: Payer: Self-pay | Admitting: *Deleted

## 2016-05-22 MED ORDER — CARBAMAZEPINE ER 100 MG PO CP12: 100.0000 mg | ORAL_CAPSULE | Freq: Every day | ORAL | 2 refills | Status: DC

## 2016-05-22 NOTE — Telephone Encounter (Signed)
Spoke to pt,  He takes carbatrol 100mg  po pm with carbatrol 300mg  po am and po pm.  It is aqua in color, and capsule.  Re did prescription and the other was a no print. Relayed to pt will do again.  I [facility] this am prior to pt and they did not receive.  Noted that was received this am.

## 2016-12-09 NOTE — Progress Notes (Signed)
[facility]  PATIENT: Francisco [name] DOB: 30-Jul-1975   REASON FOR VISIT: Follow-up for seizure disorder HISTORY FROM: Patient    HISTORY OF PRESENT ILLNESS:Mr. Capizzi, 41 year old male returns for yearly followup. He has a history of seizure disorder onset in December 1991. Carbatrol was initiated in October 1999 and he has been well controlled on the drug since that time. Last seizure was in January 2014, he had missed a dose of his medication.Three years ago   he  had 3-4 episodes of feeling like he was going to have seizure activity. Labs were drawn and his dose was increased by 13m  at night. He has not had further episodes like he was going to have seizure activity. He denies missing any doses of his medication. He has had trials of Depakote and Dilantin with compliance issues and side effects. He denies any side effects to his Carbatrol, feelings of being off balance, no daytime drowsiness. He returns for reevaluation. He needs labs and refills.   REVIEW OF SYSTEMS: Full 14 system review of systems performed and notable only for those listed, all others are neg:  Constitutional: neg  Cardiovascular: neg Ear/Nose/Throat: neg  Skin: neg Eyes: neg Respiratory: neg Gastroitestinal: neg  Hematology/Lymphatic: neg  Endocrine: neg Musculoskeletal:neg Allergy/Immunology: neg Neurological: History of seizure disorder Psychiatric: neg Sleep : neg   ALLERGIES: No Known Allergies  HOME MEDICATIONS: Outpatient Medications Prior to Visit  Medication Sig Dispense Refill  . carbamazepine (CARBATROL) 100 MG 12 hr capsule Take 1 capsule (100 mg total) by mouth at bedtime. 90 capsule 2  . carbamazepine (CARBATROL) 300 MG 12 hr capsule Take 1 capsule (300 mg total) by mouth 2 (two) times daily. 180 capsule 3  . Multiple Vitamins-Minerals (QC MENS DAILY MULTIVITAMIN PO) Take 1 tablet by mouth daily.     . pantoprazole (PROTONIX) 40 MG tablet Take 40 mg by mouth  daily.      . Na Sulfate-K Sulfate-Mg Sulf (SUPREP BOWEL PREP KIT) 17.5-3.13-1.6 GM/180ML SOLN Take 1 kit by mouth as directed. 1 Bottle 0   No facility-administered medications prior to visit.     PAST MEDICAL HISTORY: Past Medical History:  Diagnosis Date  . Acid reflux   . [place]     PAST SURGICAL HISTORY: Past Surgical History:  Procedure Laterality Date  . COLONOSCOPY N/A 01/08/2016   Procedure: COLONOSCOPY;  Surgeon: RDaneil Dolin MD;  [facility];  Service: Endoscopy;  Laterality: N/A;  1030   . None to date (12/25/15)      FAMILY HISTORY: Family History  Problem Relation Age of Onset  . Colon cancer Neg Hx     SOCIAL HISTORY: Social History   Social History  . Marital status: Married    Spouse name: SJudson Roch . Number of children: 3  . Years of education: 12+   Occupational History  .  [employer]   Social History Main Topics  . Smoking status: Never Smoker  . Smokeless tobacco: Never Used  . Alcohol use No  . Drug use: No  . Sexual activity: Not on file   Other Topics Concern  . Not on file   Social History Narrative   Patient lives at home with wife SRune Mendez    Patient have 3 children.    Patient has some college.    Patient is currently working at [employer]    Patient is right handed.      PHYSICAL EXAM  Vitals:  12/10/16 0901  BP: 128/87  Pulse: 76  Weight: 245 lb 12.8 oz (111.5 kg)   Body mass index is 33.34 kg/m. Generalized: Well developed, in no acute distress  Head: normocephalic and atraumatic,. Oropharynx benign  Neck: Supple, no carotid bruits  Musculoskeletal: No deformity   Neurological examination   Mentation: Alert oriented to time, place, history taking. Attention span and concentration appropriate. Recent and remote memory intact. Follows all commands speech and language fluent.   Cranial nerve II-XII:  Pupils were equal round reactive to light extraocular movements were full,  visual field were full on confrontational test. Facial sensation and strength were normal. hearing was intact to finger rubbing bilaterally. Uvula tongue midline. head turning and shoulder shrug were normal and symmetric.Tongue protrusion into cheek strength was normal. Motor: normal bulk and tone, full strength in the BUE, BLE, fine finger movements normal, no pronator drift. No focal weakness Coordination: finger-nose-finger, heel-to-shin bilaterally, no dysmetria Reflexes: Brachioradialis 2/2, biceps 2/2, triceps 2/2, patellar 2/2, Achilles 2/2, plantar responses were flexor bilaterally. Gait and Station: Rising up from seated position without assistance, normal stance, moderate stride, good arm swing, smooth turning, able to perform tiptoe, and heel walking without difficulty. Tandem gait is steady.  DIAGNOSTIC DATA (LABS, IMAGING, TESTING) -  ASSESSMENT AND PLAN  41 y.o. year old male  has a past medical history of Seizures;  here to follow-up. Last seizure event 2014 after missed doses of medication.  Continue Carbatrol at current dose will refill when labs back Check CBZ level, to monitor for therapeutic level toxicity CBC CMP to monitor for adverse effects Carbatrol Call for any seizure activity Follow-up yearly  Dennie Bible, [facility], [facility], [facility] [address] [place], [place] [address] [phone]

## 2016-12-10 ENCOUNTER — Encounter: Payer: Self-pay | Admitting: Nurse Practitioner

## 2016-12-10 ENCOUNTER — Ambulatory Visit (INDEPENDENT_AMBULATORY_CARE_PROVIDER_SITE_OTHER): Payer: Commercial Managed Care - PPO | Admitting: Nurse Practitioner

## 2016-12-10 VITALS — BP 128/87 | HR 76 | Wt 245.8 lb

## 2016-12-10 DIAGNOSIS — Z5181 Encounter for therapeutic drug level monitoring: Secondary | ICD-10-CM | POA: Diagnosis not present

## 2016-12-10 DIAGNOSIS — G40309 Generalized idiopathic epilepsy and epileptic syndromes, not intractable, without status epilepticus: Secondary | ICD-10-CM | POA: Diagnosis not present

## 2016-12-10 NOTE — Progress Notes (Signed)
I have read the note, and I agree with the clinical assessment and plan.  Francisco [name],Francisco [name]   

## 2016-12-10 NOTE — Patient Instructions (Signed)
Continue Carbatrol at current dose will refill  Check CBZ level, CBC CMP Call for any seizure activity Follow-up yearly

## 2016-12-11 ENCOUNTER — Telehealth: Payer: Self-pay | Admitting: *Deleted

## 2016-12-11 ENCOUNTER — Other Ambulatory Visit: Payer: Self-pay | Admitting: Nurse Practitioner

## 2016-12-11 LAB — COMPREHENSIVE METABOLIC PANEL
ALBUMIN: 4.5 g/dL (ref 3.5–5.5)
ALK PHOS: 96 IU/L (ref 39–117)
ALT: 33 IU/L (ref 0–44)
AST: 29 IU/L (ref 0–40)
Albumin/Globulin Ratio: 1.6 (ref 1.2–2.2)
BILIRUBIN TOTAL: 0.3 mg/dL (ref 0.0–1.2)
BUN / CREAT RATIO: 15 (ref 9–20)
BUN: 14 mg/dL (ref 6–24)
CHLORIDE: 99 mmol/L (ref 96–106)
CO2: 25 mmol/L (ref 20–29)
Calcium: 9.3 mg/dL (ref 8.7–10.2)
Creatinine, Ser: 0.96 mg/dL (ref 0.76–1.27)
GFR calc non Af Amer: 97 mL/min/{1.73_m2} (ref >59)
GFR, EST AFRICAN AMERICAN: 112 mL/min/{1.73_m2} (ref >59)
GLOBULIN, TOTAL: 2.9 g/dL (ref 1.5–4.5)
Glucose: 92 mg/dL (ref 65–99)
Potassium: 4.9 mmol/L (ref 3.5–5.2)
Sodium: 139 mmol/L (ref 134–144)
TOTAL PROTEIN: 7.4 g/dL (ref 6.0–8.5)

## 2016-12-11 LAB — CBC WITH DIFFERENTIAL/PLATELET
Basophils Absolute: 0 10*3/uL (ref 0.0–0.2)
Basos: 1 %
EOS (ABSOLUTE): 0.1 10*3/uL (ref 0.0–0.4)
Eos: 2 %
HEMOGLOBIN: 15.4 g/dL (ref 13.0–17.7)
Hematocrit: 45.1 % (ref 37.5–51.0)
IMMATURE GRANS (ABS): 0 10*3/uL (ref 0.0–0.1)
Immature Granulocytes: 0 %
LYMPHS: 26 %
Lymphocytes Absolute: 1.9 10*3/uL (ref 0.7–3.1)
MCH: 32.6 pg (ref 26.6–33.0)
MCHC: 34.1 g/dL (ref 31.5–35.7)
MCV: 95 fL (ref 79–97)
MONOCYTES: 8 %
Monocytes Absolute: 0.6 10*3/uL (ref 0.1–0.9)
NEUTROS ABS: 4.5 10*3/uL (ref 1.4–7.0)
Neutrophils: 63 %
Platelets: 181 10*3/uL (ref 150–379)
RBC: 4.73 x10E6/uL (ref 4.14–5.80)
RDW: 13.8 % (ref 12.3–15.4)
WBC: 7.1 10*3/uL (ref 3.4–10.8)

## 2016-12-11 LAB — CARBAMAZEPINE LEVEL, TOTAL: Carbamazepine (Tegretol), S: 7.1 ug/mL (ref 4.0–12.0)

## 2016-12-11 MED ORDER — CARBAMAZEPINE ER 300 MG PO CP12: 300.0000 mg | ORAL_CAPSULE | Freq: Two times a day (BID) | ORAL | 3 refills | Status: DC

## 2016-12-11 MED ORDER — CARBAMAZEPINE ER 100 MG PO CP12: 100.0000 mg | ORAL_CAPSULE | Freq: Every day | ORAL | 3 refills | Status: DC

## 2016-12-11 NOTE — Telephone Encounter (Signed)
Spoke with patient and informed him that his labs look good. He verbalized understanding, appreciation.

## 2016-12-18 ENCOUNTER — Ambulatory Visit: Payer: Commercial Managed Care - PPO | Admitting: Nurse Practitioner

## 2017-01-26 ENCOUNTER — Telehealth: Payer: Self-pay | Admitting: Nurse Practitioner

## 2017-01-26 MED ORDER — CARBAMAZEPINE ER 100 MG PO CP12: 100.0000 mg | ORAL_CAPSULE | Freq: Every day | ORAL | 0 refills | Status: DC

## 2017-01-26 MED ORDER — CARBAMAZEPINE ER 300 MG PO CP12: 300.0000 mg | ORAL_CAPSULE | Freq: Two times a day (BID) | ORAL | 0 refills | Status: DC

## 2017-01-26 NOTE — Telephone Encounter (Signed)
Carbatrol 300 mg tab refill x 90 days [facility] per patient's wife's request. Called wife and advised. She requested both his carbatrol refills be sent to [facility] all the time. She stated that they prefer [facility] because he travels a lot. This RN advised her that [facility] the remainder of his refills on both strengths. She also requested a 90 day supply of carbatrol 100 mg tabs be sent to [facility]; this was done. She stated she understood, verbalized appreciation.

## 2017-01-26 NOTE — Telephone Encounter (Signed)
Pt's wife called she will pick up 1 mth supply of carbamazepine (CARBATROL) 300 [facility] but she is requesting a refill to be [facility] supply. Thank you

## 2017-02-09 DIAGNOSIS — K635 Polyp of colon: Secondary | ICD-10-CM | POA: Diagnosis not present

## 2017-02-09 DIAGNOSIS — K219 Gastro-esophageal reflux disease without esophagitis: Secondary | ICD-10-CM | POA: Diagnosis not present

## 2017-02-09 DIAGNOSIS — Z1389 Encounter for screening for other disorder: Secondary | ICD-10-CM | POA: Diagnosis not present

## 2017-02-09 DIAGNOSIS — K5730 Diverticulosis of large intestine without perforation or abscess without bleeding: Secondary | ICD-10-CM | POA: Diagnosis not present

## 2017-04-27 ENCOUNTER — Other Ambulatory Visit: Payer: Self-pay | Admitting: Nurse Practitioner

## 2017-12-01 IMAGING — CT CT ABD-PELV W/ CM
2 of 5 series · 16 of 46 positions shown, 18 images · IV contrast (iopamidol)
Comparison: None

CLINICAL DATA: LEFT lower quadrant pain especially in morning worse
with palpation, rash on chest and neck, tick bite, history acid
reflux, seizures

EXAM:
CT ABDOMEN AND PELVIS WITH CONTRAST
TECHNIQUE: Multidetector CT imaging of the abdomen and pelvis was performed
using the standard protocol following bolus administration of
intravenous contrast. Sagittal and coronal MPR images reconstructed
from axial data set.
CONTRAST:  100mL 7JFWIV-FLL IOPAMIDOL (7JFWIV-FLL) INJECTION 61% at
IV. Dilute oral contrast.

[Series 2: routine abd pel with · axial · 0.76mm/px · z∈[+599,+1064]mm · 13 of 105 slices shown, 15 images]
[im 6/105  soft-tissue]
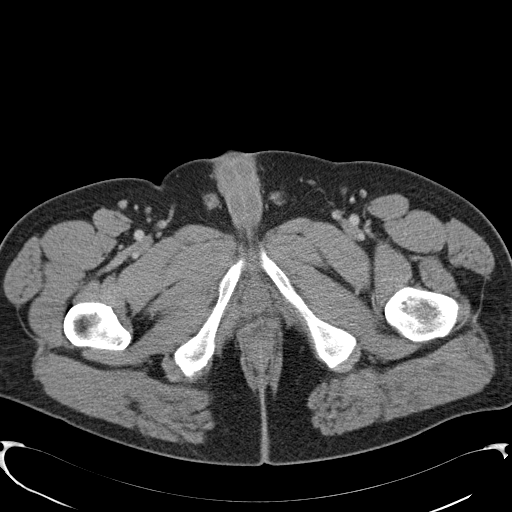
[im 6/105  bone]
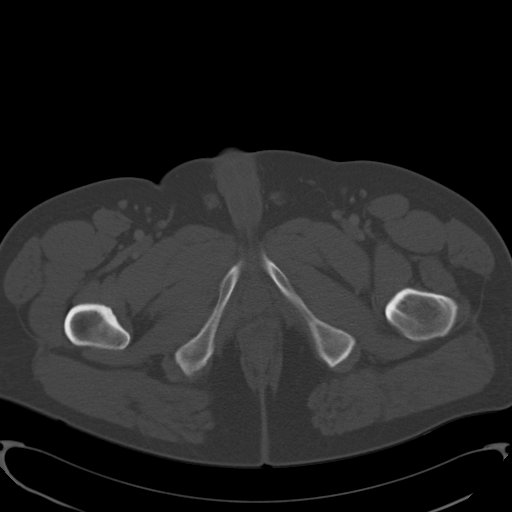
[im 17/105  soft-tissue]
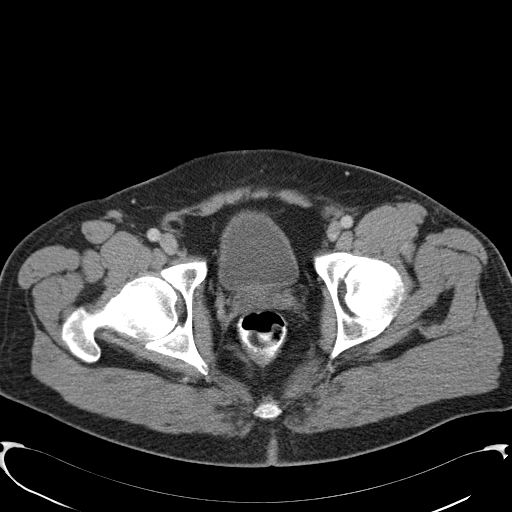
[im 22/105  soft-tissue]
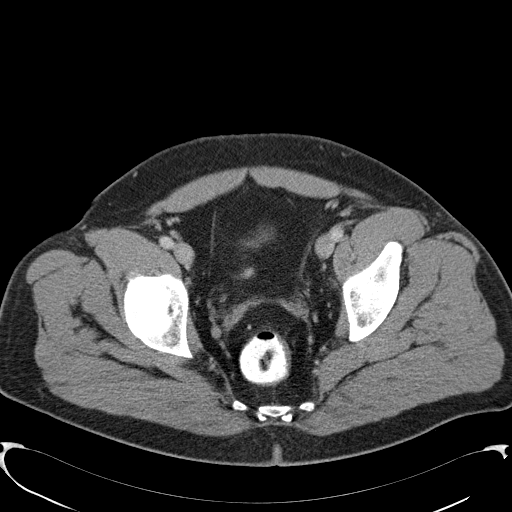
[im 28/105  soft-tissue]
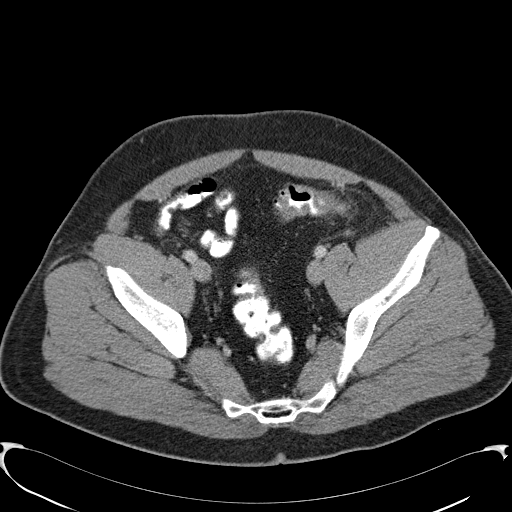
[im 39/105  soft-tissue]
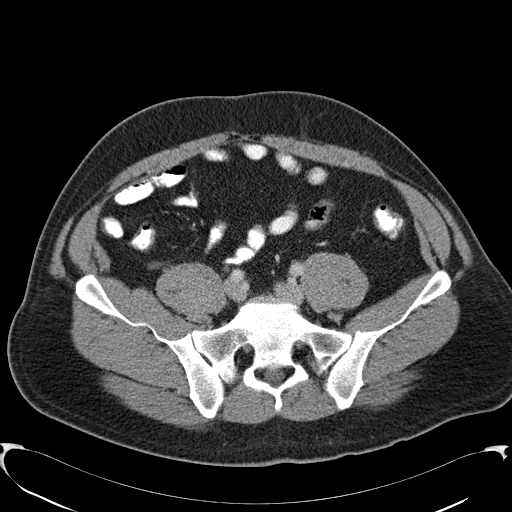
[im 44/105  soft-tissue]
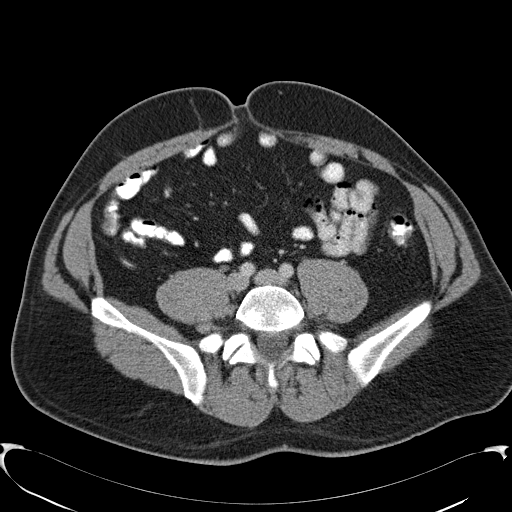
[im 55/105  soft-tissue]
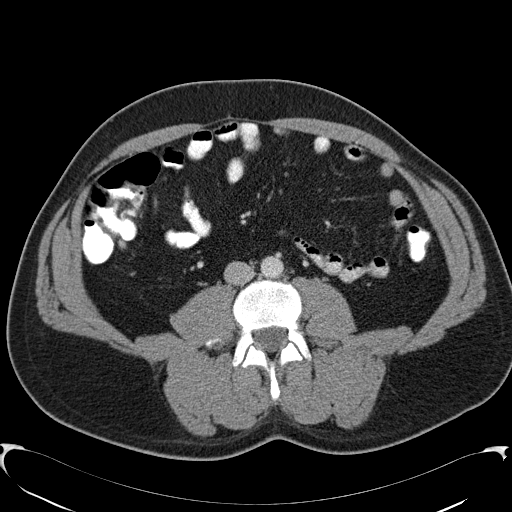
[im 61/105  soft-tissue]
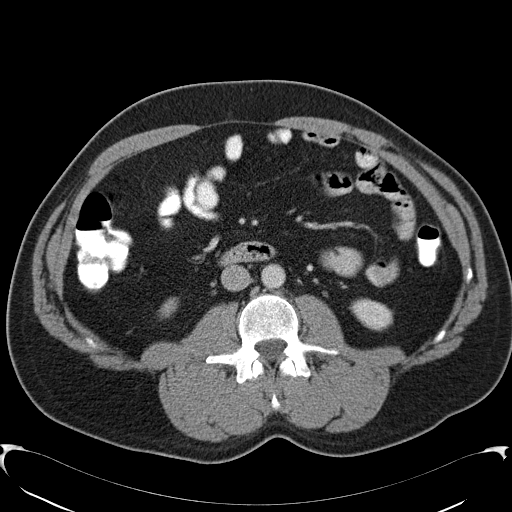
[im 66/105  soft-tissue]
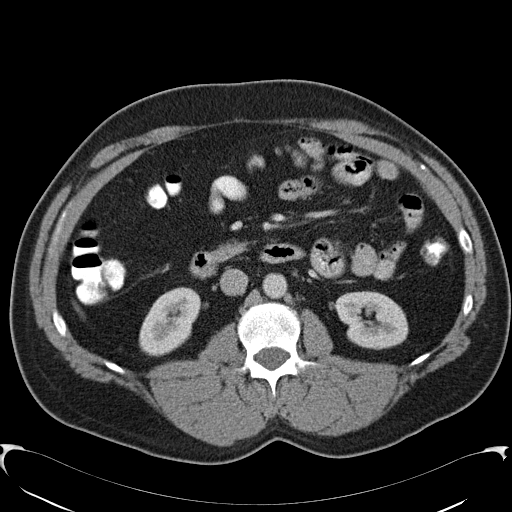
[im 66/105  bone]
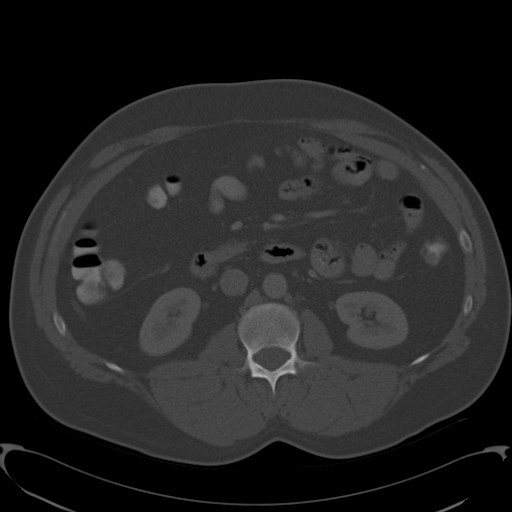
[im 77/105  soft-tissue]
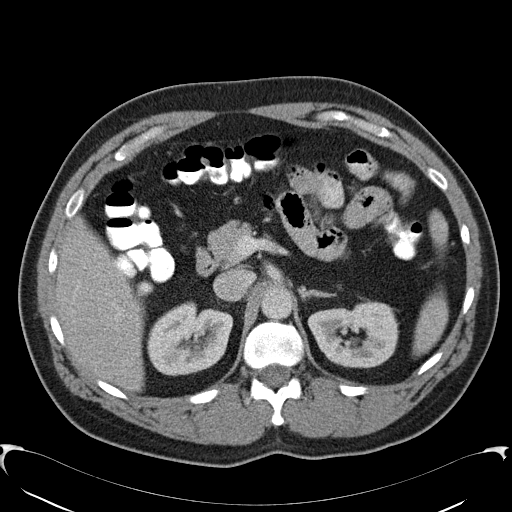
[im 83/105  soft-tissue]
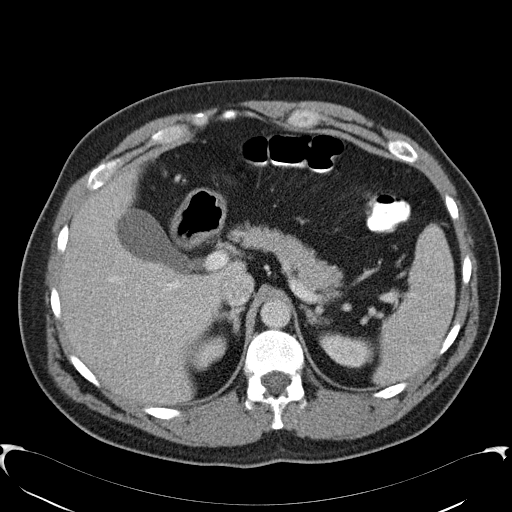
[im 88/105  soft-tissue]
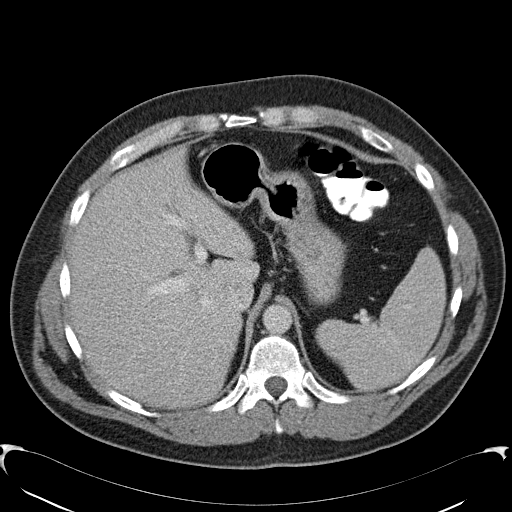
[im 99/105  soft-tissue]
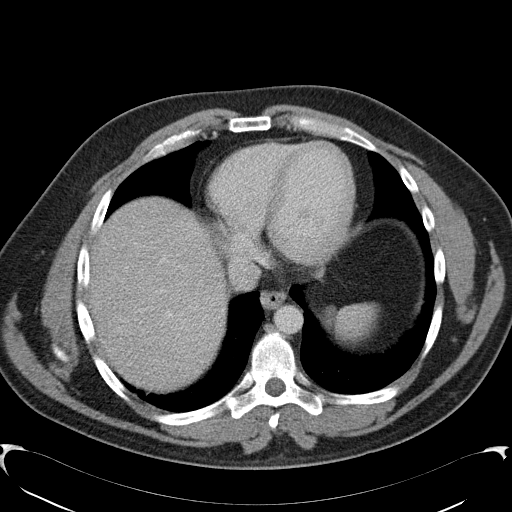

[Series 4: coronal · coronal · 0.74mm/px · 3 of 137 slices shown]
[im 46/137  soft-tissue]
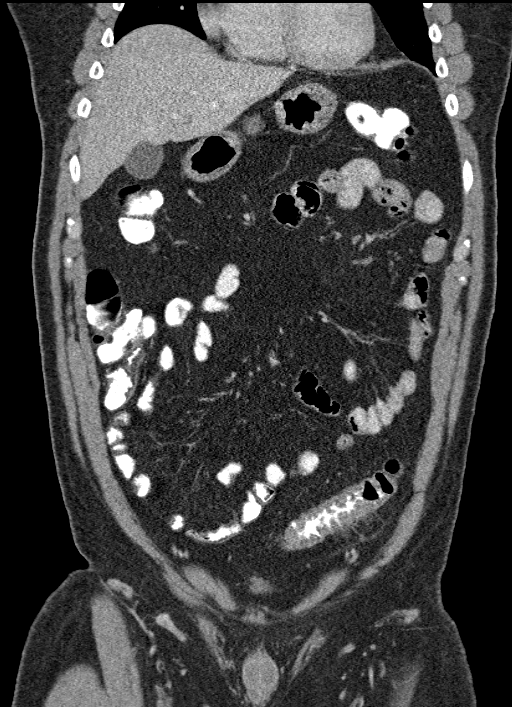
[im 61/137  soft-tissue]
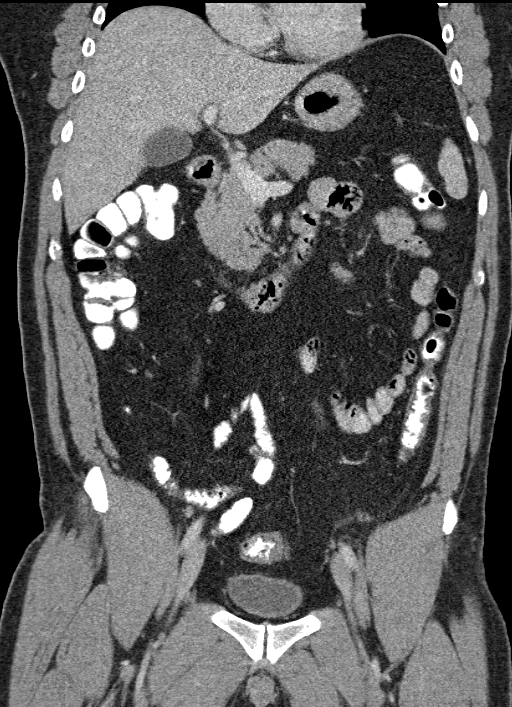
[im 76/137  soft-tissue]
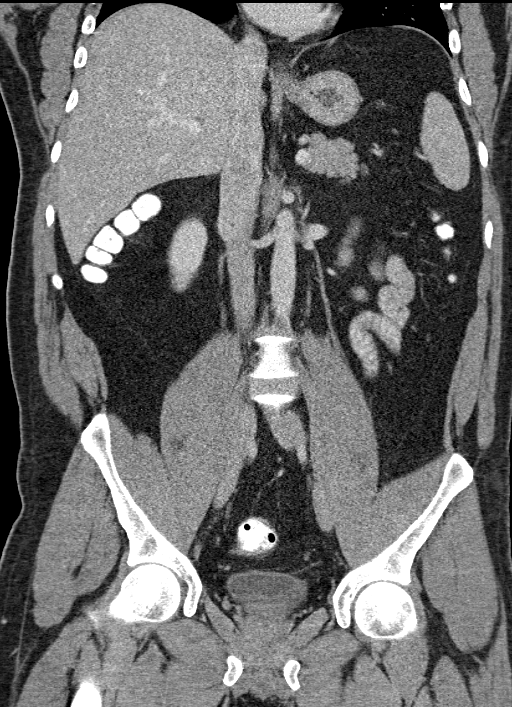

[16 of 46 positions shown; findings below may reference images not displayed]

FINDINGS: Lower chest:  Lung bases clear

Hepatobiliary: Liver and gallbladder normal appearance

Pancreas: Normal appearance

Spleen: Normal appearance

Adrenals/Urinary Tract: Adrenal glands normal appearance. Kidneys
normal appearance. No urinary tract calcification or dilatation.
Unremarkable ureters and bladder.

Stomach/Bowel: Normal appendix. Diverticulosis of descending and
sigmoid colon. Colonic wall thickening and pericolic infiltrative
changes at proximal sigmoid colon consistent with acute
diverticulitis. No definite evidence of perforation or abscess.

Vascular/Lymphatic: Vascular structures grossly patent on
nondedicated exam. No adenopathy.

Reproductive: N/A

Other: No mass, free air, free fluid or hernia.

Musculoskeletal: Normal appearance
IMPRESSION: Distal colonic diverticulosis with wall thickening and mild
pericolic inflammatory changes at proximal sigmoid colon consistent
with acute diverticulitis.

No definite evidence of perforation or abscess.

## 2017-12-08 ENCOUNTER — Telehealth: Payer: Self-pay | Admitting: *Deleted

## 2017-12-08 NOTE — Telephone Encounter (Signed)
LVM requesting patient call back to see if he can come sooner for follow up with Daun Peacock, NP on 02/17/18.

## 2017-12-13 ENCOUNTER — Ambulatory Visit: Payer: Commercial Managed Care - PPO | Admitting: Nurse Practitioner

## 2018-04-23 ENCOUNTER — Other Ambulatory Visit: Payer: Self-pay | Admitting: Nurse Practitioner

## 2018-06-06 DIAGNOSIS — J00 Acute nasopharyngitis [common cold]: Secondary | ICD-10-CM | POA: Diagnosis not present

## 2018-06-08 NOTE — Progress Notes (Signed)
[facility]  PATIENT: Francisco [name] DOB: [birth year]   REASON FOR VISIT: Follow-up for seizure disorder HISTORY FROM: Patient    HISTORY OF PRESENT ILLNESS:Francisco [name], 43 year old male returns for yearly followup. He has a history of seizure disorder onset in December 1991. Carbatrol was initiated in October 1999 and he has been well controlled on the drug since that time. Last seizure was in January 2014, he had missed a dose of his medication.Four  years ago   he  had 3-4 episodes of feeling like he was going to have seizure activity. Labs were drawn and his dose was increased by 100mg   at night. He has not had further episodes like he was going to have seizure activity. He denies missing any doses of his medication. He has had trials of Depakote and Dilantin with compliance issues and side effects. He denies any side effects to his Carbatrol, feelings of being off balance, no daytime drowsiness. He continues to drive without difficulty.  He continues to work full-time.  He returns for reevaluation. He needs labs and refills.   REVIEW OF SYSTEMS: Full 14 system review of systems performed and notable only for those listed, all others are neg:  Constitutional: neg  Cardiovascular: neg Ear/Nose/Throat: neg  Skin: neg Eyes: neg Respiratory: neg Gastroitestinal: neg  Hematology/Lymphatic: neg  Endocrine: neg Musculoskeletal:neg Allergy/Immunology: neg Neurological: History of seizure disorder Psychiatric: neg Sleep : neg   ALLERGIES: No Known Allergies  HOME MEDICATIONS: Outpatient Medications Prior to Visit  Medication Sig Dispense Refill  . carbamazepine (CARBATROL) 100 MG 12 hr capsule TAKE 1 CAPSULE AT BEDTIME 90 capsule 0  . carbamazepine (CARBATROL) 300 MG 12 hr capsule TAKE 1 CAPSULE TWICE A DAY 180 capsule 0  . Multiple Vitamins-Minerals (QC MENS DAILY MULTIVITAMIN PO) Take 1 tablet by mouth daily.     . pantoprazole (PROTONIX) 40 MG tablet Take 40  mg by mouth daily.       No facility-administered medications prior to visit.     PAST MEDICAL HISTORY: Past Medical History:  Diagnosis Date  . Acid reflux   . Seizures ([place])     PAST SURGICAL HISTORY: Past Surgical History:  Procedure Laterality Date  . COLONOSCOPY N/A 01/08/2016   Procedure: COLONOSCOPY;  Surgeon: Daneil Dolin, MD;  [facility];  Service: Endoscopy;  Laterality: N/A;  1030   . None to date (12/25/15)      FAMILY HISTORY: Family History  Problem Relation Age of Onset  . Colon cancer Neg Hx     SOCIAL HISTORY: Social History   Socioeconomic History  . Marital status: Married    Spouse name: Francisco [name]  . Number of children: 3  . Years of education: 12+  . Highest education level: Not on file  Occupational History    Employer: [employer]  . Financial resource strain: Not on file  . Food insecurity:    Worry: Not on file    Inability: Not on file  . Transportation needs:    Medical: Not on file    Non-medical: Not on file  Tobacco Use  . Smoking status: Never Smoker  . Smokeless tobacco: Never Used  Substance and Sexual Activity  . Alcohol use: No  . Drug use: No  . Sexual activity: Not on file  Lifestyle  . Physical activity:    Days per week: Not on file    Minutes per session: Not on file  . Stress: Not on  file  Relationships  . Social connections:    Talks on phone: Not on file    Gets together: Not on file    Attends religious service: Not on file    Active member of club or organization: Not on file    Attends meetings of clubs or organizations: Not on file    Relationship status: Not on file  . Intimate partner violence:    Fear of current or ex partner: Not on file    Emotionally abused: Not on file    Physically abused: Not on file    Forced sexual activity: Not on file  Other Topics Concern  . Not on file  Social History Narrative   Patient lives at home with wife Francisco [name].     Patient have 3 children.    Patient has some college.    Patient is currently working at [employer].    Patient is right handed.      PHYSICAL EXAM  Vitals:   06/09/18 0719  BP: 132/89  Pulse: 68  Weight: 248 lb (112.5 kg)  Height: 6' (1.829 m)   Body mass index is 33.63 kg/m. Generalized: Well developed, in no acute distress  Head: normocephalic and atraumatic,. Oropharynx benign  Neck: Supple,  Musculoskeletal: No deformity   Neurological examination   Mentation: Alert oriented to time, place, history taking. Attention span and concentration appropriate. Recent and remote memory intact. Follows all commands speech and language fluent.   Cranial nerve II-XII:  Pupils were equal round reactive to light extraocular movements were full, visual field were full on confrontational test. Facial sensation and strength were normal. hearing was intact to finger rubbing bilaterally. Uvula tongue midline. head turning and shoulder shrug were normal and symmetric.Tongue protrusion into cheek strength was normal. Motor: normal bulk and tone, full strength in the BUE, BLE, fine finger movements normal, no pronator drift. No focal weakness Coordination: finger-nose-finger, heel-to-shin bilaterally, no dysmetria Reflexes: Symmetric upper and lower, plantar responses were flexor bilaterally. Gait and Station: Rising up from seated position without assistance, normal stance, moderate stride, good arm swing, smooth turning, able to perform tiptoe, and heel walking without difficulty. Tandem gait is steady.  DIAGNOSTIC DATA (LABS, IMAGING, TESTING) -  ASSESSMENT AND PLAN  43 y.o. year old male  has a past medical history of Seizures;  here to follow-up. Last seizure event 2014 after missed doses of medication.  Continue Carbatrol at current dose will refill when labs back Check CBZ level, to monitor for therapeutic level toxicity CBC CMP to monitor for adverse effects Carbatrol Call for any  seizure activity Follow-up yearly  Dennie Bible, [facility], [facility], [facility] [address] [place], [place] [address] [phone]

## 2018-06-09 ENCOUNTER — Encounter: Payer: Self-pay | Admitting: Nurse Practitioner

## 2018-06-09 ENCOUNTER — Ambulatory Visit (INDEPENDENT_AMBULATORY_CARE_PROVIDER_SITE_OTHER): Payer: Commercial Managed Care - PPO | Admitting: Nurse Practitioner

## 2018-06-09 VITALS — BP 132/89 | HR 68 | Ht 72.0 in | Wt 248.0 lb

## 2018-06-09 DIAGNOSIS — Z5181 Encounter for therapeutic drug level monitoring: Secondary | ICD-10-CM | POA: Diagnosis not present

## 2018-06-09 DIAGNOSIS — G40309 Generalized idiopathic epilepsy and epileptic syndromes, not intractable, without status epilepticus: Secondary | ICD-10-CM

## 2018-06-09 NOTE — Progress Notes (Signed)
I have read the note, and I agree with the clinical assessment and plan.  Francisco [name]   

## 2018-06-09 NOTE — Patient Instructions (Signed)
Continue Carbatrol at current dose will refill when labs back Check CBZ level, to monitor for therapeutic level toxicity CBC CMP to monitor for adverse effects Carbatrol Call for any seizure activity Follow-up yearly

## 2018-06-10 ENCOUNTER — Other Ambulatory Visit: Payer: Self-pay | Admitting: Nurse Practitioner

## 2018-06-10 ENCOUNTER — Telehealth: Payer: Self-pay | Admitting: *Deleted

## 2018-06-10 LAB — CBC WITH DIFFERENTIAL/PLATELET
BASOS: 1 %
Basophils Absolute: 0 10*3/uL (ref 0.0–0.2)
EOS (ABSOLUTE): 0.2 10*3/uL (ref 0.0–0.4)
EOS: 3 %
HEMATOCRIT: 44.5 % (ref 37.5–51.0)
Hemoglobin: 15.8 g/dL (ref 13.0–17.7)
IMMATURE GRANS (ABS): 0 10*3/uL (ref 0.0–0.1)
IMMATURE GRANULOCYTES: 0 %
LYMPHS: 35 %
Lymphocytes Absolute: 1.7 10*3/uL (ref 0.7–3.1)
MCH: 33.2 pg — ABNORMAL HIGH (ref 26.6–33.0)
MCHC: 35.5 g/dL (ref 31.5–35.7)
MCV: 94 fL (ref 79–97)
Monocytes Absolute: 0.5 10*3/uL (ref 0.1–0.9)
Monocytes: 10 %
NEUTROS PCT: 51 %
Neutrophils Absolute: 2.5 10*3/uL (ref 1.4–7.0)
PLATELETS: 170 10*3/uL (ref 150–450)
RBC: 4.76 x10E6/uL (ref 4.14–5.80)
RDW: 13.2 % (ref 11.6–15.4)
WBC: 4.8 10*3/uL (ref 3.4–10.8)

## 2018-06-10 LAB — COMPREHENSIVE METABOLIC PANEL
ALT: 28 IU/L (ref 0–44)
AST: 25 IU/L (ref 0–40)
Albumin/Globulin Ratio: 1.7 (ref 1.2–2.2)
Albumin: 4.7 g/dL (ref 4.0–5.0)
Alkaline Phosphatase: 89 IU/L (ref 39–117)
BUN/Creatinine Ratio: 14 (ref 9–20)
BUN: 14 mg/dL (ref 6–24)
Bilirubin Total: 0.3 mg/dL (ref 0.0–1.2)
CALCIUM: 9 mg/dL (ref 8.7–10.2)
CO2: 23 mmol/L (ref 20–29)
CREATININE: 1 mg/dL (ref 0.76–1.27)
Chloride: 99 mmol/L (ref 96–106)
GFR calc Af Amer: 105 mL/min/{1.73_m2} (ref >59)
GFR, EST NON AFRICAN AMERICAN: 91 mL/min/{1.73_m2} (ref >59)
GLUCOSE: 115 mg/dL — AB (ref 65–99)
Globulin, Total: 2.8 g/dL (ref 1.5–4.5)
Potassium: 4.4 mmol/L (ref 3.5–5.2)
Sodium: 140 mmol/L (ref 134–144)
Total Protein: 7.5 g/dL (ref 6.0–8.5)

## 2018-06-10 LAB — CARBAMAZEPINE LEVEL, TOTAL: Carbamazepine (Tegretol), S: 8.9 ug/mL (ref 4.0–12.0)

## 2018-06-10 MED ORDER — CARBAMAZEPINE ER 100 MG PO CP12: 100.0000 mg | ORAL_CAPSULE | Freq: Every day | ORAL | 3 refills | Status: DC

## 2018-06-10 MED ORDER — CARBAMAZEPINE ER 300 MG PO CP12: 300.0000 mg | ORAL_CAPSULE | Freq: Two times a day (BID) | ORAL | 3 refills | Status: DC

## 2018-06-10 NOTE — Telephone Encounter (Signed)
LMOM with below lab results.  He does not need to return this call unless he has questions/fim 

## 2018-06-10 NOTE — Telephone Encounter (Signed)
-----   Message from Dennie Bible, NP sent at 06/10/2018  8:33 AM EST ----- Labs stable please call the patient

## 2018-06-14 ENCOUNTER — Telehealth: Payer: Self-pay | Admitting: *Deleted

## 2018-06-14 MED ORDER — CARBAMAZEPINE ER 100 MG PO CP12: 100.0000 mg | ORAL_CAPSULE | Freq: Every day | ORAL | 3 refills | Status: DC

## 2018-06-14 NOTE — Telephone Encounter (Signed)
According to what I see in the recent med list ordering they were dispensed as generic.

## 2018-06-14 NOTE — Telephone Encounter (Signed)
New prescription sent for generic carbamazepine (carbatrol) 100mg  ER caps.

## 2018-06-14 NOTE — Telephone Encounter (Signed)
[facility], ? 300mg  caps as generic and 100mg  as BN.  I called pt and he thought he was taking carbamazepine, then [facility] [place], last fills 01-2017 for both strengths were generic.   Please advise if 100mg  caps are BN or generic.  ( from previous entries, 100mg  has been BN and the 300mg  has been generic).  I called [facility] and has gotten generic 01-26-2017 of the 100mg  caps.

## 2019-06-13 ENCOUNTER — Other Ambulatory Visit: Payer: Self-pay

## 2019-06-13 MED ORDER — CARBAMAZEPINE ER 100 MG PO CP12: 100.0000 mg | ORAL_CAPSULE | Freq: Every day | ORAL | 0 refills | Status: DC

## 2019-06-26 ENCOUNTER — Other Ambulatory Visit: Payer: Self-pay

## 2019-06-26 MED ORDER — CARBAMAZEPINE ER 300 MG PO CP12: 300.0000 mg | ORAL_CAPSULE | Freq: Two times a day (BID) | ORAL | 1 refills | Status: DC

## 2019-06-26 NOTE — Telephone Encounter (Signed)
Received a fax from [facility] where the patient is requesting a refill on carbamazepine (CARBATROL) 300 MG 12 hr capsule.

## 2019-06-26 NOTE — Telephone Encounter (Signed)
I will refill the Carbatrol.  The patient was seen about a year ago, no revisit appointment is listed, we need to have the patient seen in the next several months.

## 2019-06-26 NOTE — Addendum Note (Signed)
Addended by: Kathrynn Ducking on: 06/26/2019 03:10 PM   Modules accepted: Orders

## 2019-07-17 ENCOUNTER — Telehealth: Payer: Self-pay

## 2019-07-17 NOTE — Telephone Encounter (Signed)
If patient calls back he was last seen January 2020. Pt needs an appt with [name] NP asap to continue refills.  Vm was left for patient stating this in detail.

## 2019-08-08 ENCOUNTER — Other Ambulatory Visit: Payer: Self-pay

## 2019-08-08 ENCOUNTER — Encounter: Payer: Self-pay | Admitting: Neurology

## 2019-08-08 ENCOUNTER — Ambulatory Visit (INDEPENDENT_AMBULATORY_CARE_PROVIDER_SITE_OTHER): Payer: Commercial Managed Care - PPO | Admitting: Neurology

## 2019-08-08 VITALS — BP 123/81 | HR 71 | Temp 97.0°F | Ht 72.0 in | Wt 240.0 lb

## 2019-08-08 DIAGNOSIS — G40309 Generalized idiopathic epilepsy and epileptic syndromes, not intractable, without status epilepticus: Secondary | ICD-10-CM | POA: Diagnosis not present

## 2019-08-08 MED ORDER — CARBAMAZEPINE ER 100 MG PO CP12: 100.0000 mg | ORAL_CAPSULE | Freq: Every day | ORAL | 4 refills | Status: DC

## 2019-08-08 MED ORDER — CARBAMAZEPINE ER 300 MG PO CP12: 300.0000 mg | ORAL_CAPSULE | Freq: Two times a day (BID) | ORAL | 4 refills | Status: DC

## 2019-08-08 NOTE — Progress Notes (Signed)
PATIENT: [name] DOB: Dec 06, 1975  REASON FOR VISIT: follow up HISTORY FROM: patient  HISTORY OF PRESENT ILLNESS: Today 08/08/19 Mr. Joachim is a 44 year old male with history of seizure disorder.  His seizures have been well controlled since October 1999 when Carbatrol was initiated.  His last seizure was in January 2014, when he missed a dose of his medication.  He has previously tried Depakote and Dilantin with compliance issues and side effects. He continues to do well, no side effects, or recurrent seizure.  Since last seen, he is now taking blood pressure medication.  He has routine follow-up with PCP.  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  He denies any new problems or concerns.  He presents today for follow-up unaccompanied.  HISTORY 06/09/2018 CM: HISTORY OF PRESENT ILLNESS:Mr. Hlinka, 44 year old male returns for yearly followup. He has a history of seizure disorder onset in December 1991. Carbatrol was initiated in October 1999 and he has been well controlled on the drug since that time. Last seizure was in January 2014, he had missed a dose of his medication.Four  years ago   he  had 3-4 episodes of feeling like he was going to have seizure activity. Labs were drawn and his dose was increased by 100mg   at night. He has not had further episodes like he was going to have seizure activity. He denies missing any doses of his medication. He has had trials of Depakote and Dilantin with compliance issues and side effects. He denies any side effects to his Carbatrol, feelings of being off balance, no daytime drowsiness. He continues to drive without difficulty.  He continues to work full-time.  He returns for reevaluation. He needs labs and refills.   REVIEW OF SYSTEMS: Out of a complete 14 system review of symptoms, the patient complains only of the following symptoms, and all other reviewed systems are negative.  Seizures  ALLERGIES: No Known  Allergies  HOME MEDICATIONS: Outpatient Medications Prior to Visit  Medication Sig Dispense Refill  . carbamazepine (CARBATROL) 100 MG 12 hr capsule Take 1 capsule (100 mg total) by mouth at bedtime. This replaces previous carbamazepine (carbatrol) 100mg  ER prescription. 30 capsule 0  . carbamazepine (CARBATROL) 300 MG 12 hr capsule Take 1 capsule (300 mg total) by mouth 2 (two) times daily. 180 capsule 1  . Multiple Vitamins-Minerals (QC MENS DAILY MULTIVITAMIN PO) Take 1 tablet by mouth daily.     Marland Kitchen olmesartan (BENICAR) 40 MG tablet Take 40 mg by mouth daily.    . pantoprazole (PROTONIX) 40 MG tablet Take 40 mg by mouth daily.      . rosuvastatin (CRESTOR) 10 MG tablet Take 10 mg by mouth at bedtime.     No facility-administered medications prior to visit.    PAST MEDICAL HISTORY: Past Medical History:  Diagnosis Date  . Acid reflux   . Seizures ([place])     PAST SURGICAL HISTORY: Past Surgical History:  Procedure Laterality Date  . COLONOSCOPY N/A 01/08/2016   Procedure: COLONOSCOPY;  Surgeon: Daneil Dolin, MD;  Location[facility];  Service: Endoscopy;  Laterality: N/A;  1030   . None to date (12/25/15)      FAMILY HISTORY: Family History  Problem Relation Age of Onset  . Colon cancer Neg Hx     SOCIAL HISTORY: Social History   Socioeconomic History  . Marital status: Married    Spouse name: [name]  . Number of children: 3  .  Years of education: 12+  . Highest education level: Not on file  Occupational History    Employer: [employer]  Tobacco Use  . Smoking status: Never Smoker  . Smokeless tobacco: Never Used  Substance and Sexual Activity  . Alcohol use: No  . Drug use: No  . Sexual activity: Not on file  Other Topics Concern  . Not on file  Social History Narrative   Patient lives at home with wife Keeland Talkington.    Patient have 3 children.    Patient has some college.    Patient is currently working at [employer].    Patient is right handed.     Social Determinants of Health   Financial Resource Strain:   . Difficulty of Paying Living Expenses:   Food Insecurity:   . Worried About Charity fundraiser in the Last Year:   . Arboriculturist in the Last Year:   Transportation Needs:   . Film/video editor (Medical):   Marland Kitchen Lack of Transportation (Non-Medical):   Physical Activity:   . Days of Exercise per Week:   . Minutes of Exercise per Session:   Stress:   . Feeling of Stress :   Social Connections:   . Frequency of Communication with Friends and Family:   . Frequency of Social Gatherings with Friends and Family:   . Attends Religious Services:   . Active Member of Clubs or Organizations:   . Attends Archivist Meetings:   Marland Kitchen Marital Status:   Intimate Partner Violence:   . Fear of Current or Ex-Partner:   . Emotionally Abused:   Marland Kitchen Physically Abused:   . Sexually Abused:    PHYSICAL EXAM  Vitals:   08/08/19 0824  BP: 123/81  Pulse: 71  Temp: (!) 97 F (36.1 C)  Weight: 240 lb (108.9 kg)  Height: 6' (1.829 m)   Body mass index is 32.55 kg/m.  Generalized: Well developed, in no acute distress   Neurological examination  Mentation: Alert oriented to time, place, history taking. Follows all commands speech and language fluent Cranial nerve II-XII: Pupils were equal round reactive to light. Extraocular movements were full, visual field were full on confrontational test. Facial sensation and strength were normal. Head turning and shoulder shrug  were normal and symmetric. Motor: The motor testing reveals 5 over 5 strength of all 4 extremities. Good symmetric motor tone is noted throughout.  Sensory: Sensory testing is intact to soft touch on all 4 extremities. No evidence of extinction is noted.  Coordination: Cerebellar testing reveals good finger-nose-finger and heel-to-shin bilaterally.  Gait and station: Gait is normal. Tandem gait is normal. Romberg is negative. No drift is seen.  Reflexes: Deep  tendon reflexes are symmetric and normal bilaterally.   DIAGNOSTIC DATA (LABS, IMAGING, TESTING) - I reviewed patient records, labs, notes, testing and imaging myself where available.  Lab Results  Component Value Date   WBC 4.8 06/09/2018   HGB 15.8 06/09/2018   HCT 44.5 06/09/2018   MCV 94 06/09/2018   PLT 170 06/09/2018      Component Value Date/Time   NA 140 06/09/2018 0805   K 4.4 06/09/2018 0805   CL 99 06/09/2018 0805   CO2 23 06/09/2018 0805   GLUCOSE 115 (H) 06/09/2018 0805   GLUCOSE 148 (H) 03/16/2011 0105   BUN 14 06/09/2018 0805   CREATININE 1.00 06/09/2018 0805   CALCIUM 9.0 06/09/2018 0805   PROT 7.5 06/09/2018 0805   ALBUMIN  4.7 06/09/2018 0805   AST 25 06/09/2018 0805   ALT 28 06/09/2018 0805   ALKPHOS 89 06/09/2018 0805   BILITOT 0.3 06/09/2018 0805   GFRNONAA 91 06/09/2018 0805   GFRAA 105 06/09/2018 0805   No results found for: CHOL, HDL, LDLCALC, LDLDIRECT, TRIG, CHOLHDL No results found for: HGBA1C No results found for: VITAMINB12 No results found for: TSH    ASSESSMENT AND PLAN 44 y.o. year old male  has a past medical history of Acid reflux and Seizures ([place]). here with:  1.  Seizures, well controlled  He continues to do well.  His last seizure occurred in 2014, as result of missing a dose of his medication.  He will remain on Carbatrol 300 mg twice daily, Carbatrol 100 mg at bedtime.  I will check blood work today. He will call for recurrent seizure, discussed since stable, can follow-up with PCP in the future, for labs and refills. For now, will see in 1 year or sooner if needed.  I spent 20 minutes of face-to-face and non-face-to-face time with patient.  This included previsit chart review, lab review, study review, order entry, electronic health record documentation, patient education.  Butler Denmark, AGNP-C, DNP 08/08/2019, 8:47 AM [facility] [address] [place], [place] [address] [phone]

## 2019-08-08 NOTE — Patient Instructions (Signed)
It was nice to meet you today! Continue current medications Check blood work today  Call for recurrent seizure  See you in 1 year or sooner if needed

## 2019-08-08 NOTE — Progress Notes (Signed)
I have read the note, and I agree with the clinical assessment and plan.  Earlie Schank K Raeden Schippers   

## 2019-08-09 ENCOUNTER — Telehealth: Payer: Self-pay | Admitting: *Deleted

## 2019-08-09 LAB — CBC WITH DIFFERENTIAL/PLATELET
Basophils Absolute: 0 10*3/uL (ref 0.0–0.2)
Basos: 1 %
EOS (ABSOLUTE): 0.1 10*3/uL (ref 0.0–0.4)
Eos: 2 %
Hematocrit: 42.5 % (ref 37.5–51.0)
Hemoglobin: 14.7 g/dL (ref 13.0–17.7)
Immature Grans (Abs): 0 10*3/uL (ref 0.0–0.1)
Immature Granulocytes: 0 %
Lymphocytes Absolute: 1.9 10*3/uL (ref 0.7–3.1)
Lymphs: 30 %
MCH: 32.9 pg (ref 26.6–33.0)
MCHC: 34.6 g/dL (ref 31.5–35.7)
MCV: 95 fL (ref 79–97)
Monocytes Absolute: 0.5 10*3/uL (ref 0.1–0.9)
Monocytes: 8 %
Neutrophils Absolute: 3.7 10*3/uL (ref 1.4–7.0)
Neutrophils: 59 %
Platelets: 185 10*3/uL (ref 150–450)
RBC: 4.47 x10E6/uL (ref 4.14–5.80)
RDW: 12.6 % (ref 11.6–15.4)
WBC: 6.3 10*3/uL (ref 3.4–10.8)

## 2019-08-09 LAB — COMPREHENSIVE METABOLIC PANEL
ALT: 27 IU/L (ref 0–44)
AST: 25 IU/L (ref 0–40)
Albumin/Globulin Ratio: 1.8 (ref 1.2–2.2)
Albumin: 4.6 g/dL (ref 4.0–5.0)
Alkaline Phosphatase: 106 IU/L (ref 39–117)
BUN/Creatinine Ratio: 11 (ref 9–20)
BUN: 10 mg/dL (ref 6–24)
Bilirubin Total: 0.3 mg/dL (ref 0.0–1.2)
CO2: 25 mmol/L (ref 20–29)
Calcium: 9.1 mg/dL (ref 8.7–10.2)
Chloride: 102 mmol/L (ref 96–106)
Creatinine, Ser: 0.93 mg/dL (ref 0.76–1.27)
GFR calc Af Amer: 114 mL/min/{1.73_m2} (ref >59)
GFR calc non Af Amer: 99 mL/min/{1.73_m2} (ref >59)
Globulin, Total: 2.5 g/dL (ref 1.5–4.5)
Glucose: 99 mg/dL (ref 65–99)
Potassium: 4.3 mmol/L (ref 3.5–5.2)
Sodium: 138 mmol/L (ref 134–144)
Total Protein: 7.1 g/dL (ref 6.0–8.5)

## 2019-08-09 LAB — CARBAMAZEPINE LEVEL, TOTAL: Carbamazepine (Tegretol), S: 8.8 ug/mL (ref 4.0–12.0)

## 2019-08-09 NOTE — Telephone Encounter (Signed)
-----   Message from Suzzanne Cloud, NP sent at 08/09/2019 10:17 AM EDT ----- Labs are normal, carbamazepine level is within range.

## 2019-08-09 NOTE — Telephone Encounter (Signed)
Left patient a detailed message, with results, on voicemail (ok per DPR).  Provided our number to call back with any questions.  

## 2019-08-10 ENCOUNTER — Other Ambulatory Visit: Payer: Self-pay

## 2019-08-16 ENCOUNTER — Telehealth: Payer: Self-pay | Admitting: Neurology

## 2019-08-16 NOTE — Telephone Encounter (Signed)
[name],Francisco(wife on DPR) has called asking the carbamazepine (CARBATROL) 100 MG 12 hr capsule &  carbamazepine (CARBATROL) 300 MG 12 hr capsule  Go back to     [place]

## 2019-08-17 MED ORDER — CARBAMAZEPINE ER 300 MG PO CP12: 300.0000 mg | ORAL_CAPSULE | Freq: Two times a day (BID) | ORAL | 4 refills | Status: DC

## 2019-08-17 MED ORDER — CARBAMAZEPINE ER 100 MG PO CP12: 100.0000 mg | ORAL_CAPSULE | Freq: Every day | ORAL | 4 refills | Status: DC

## 2019-08-17 NOTE — Addendum Note (Signed)
Addended by: Oliver Hum S on: 08/17/2019 11:04 AM   Modules accepted: Orders

## 2019-08-17 NOTE — Telephone Encounter (Signed)
Done as requested.

## 2019-08-17 NOTE — Telephone Encounter (Signed)
Spoke to pt

## 2020-08-06 NOTE — Progress Notes (Signed)
PATIENT: Francisco [name] DOB: 01/27/1976  REASON FOR VISIT: follow up HISTORY FROM: patient  HISTORY OF PRESENT ILLNESS: Today 08/07/20 Francisco [name] is a 45 year old male with history of seizure disorder.  Seizures have been well controlled since 1999 when Carbatrol was initiated.  Last seizure was in January 2014 when he missed a dose. 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  Often has difficulty getting this from the pharmacy, so doesn't take. He knows his triggers for these feelings: Stress, sleep deprivation, missing a dose.  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  Here today for evaluation unaccompanied.  Update 08/08/2019 SS: Francisco [name] is a 45 year old male with history of seizure disorder.  His seizures have been well controlled since October 1999 when Carbatrol was initiated.  His last seizure was in January 2014, when he missed a dose of his medication.  He has previously tried Depakote and Dilantin with compliance issues and side effects. He continues to do well, no side effects, [name] recurrent seizure.  Since last seen, he is now taking blood pressure medication.  He has routine follow-up with PCP.  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  He denies any new problems [name] concerns.  He presents today for follow-up unaccompanied.  HISTORY 06/09/2018 CM: HISTORY OF PRESENT ILLNESS:Francisco [name], 45 year old male returns for yearly followup. He has a history of seizure disorder onset in December 1991. Carbatrol was initiated in October 1999 and he has been well controlled on the drug since that time. Last seizure was in January 2014, he had missed a dose of his medication.Four  years ago   he  had 3-4 episodes of feeling like he was going to have seizure activity. Labs were drawn and his dose was increased by 100mg   at night. He has  not had further episodes like he was going to have seizure activity. He denies missing any doses of his medication. He has had trials of Depakote and Dilantin with compliance issues and side effects. He denies any side effects to his Carbatrol, feelings of being off balance, no daytime drowsiness. He continues to drive without difficulty.  He continues to work full-time.  He returns for reevaluation. He needs labs and refills.  REVIEW OF SYSTEMS: Out of a complete 14 system review of symptoms, the patient complains only of the following symptoms, and all other reviewed systems are negative.  Seizures  ALLERGIES: No Known Allergies  HOME MEDICATIONS: Outpatient Medications Prior to Visit  Medication Sig Dispense Refill  . Multiple Vitamins-Minerals (QC MENS DAILY MULTIVITAMIN PO) Take 1 tablet by mouth daily.     Marland Kitchen olmesartan (BENICAR) 40 MG tablet Take 40 mg by mouth daily.    . pantoprazole (PROTONIX) 40 MG tablet Take 40 mg by mouth daily.    . rosuvastatin (CRESTOR) 10 MG tablet Take 10 mg by mouth at bedtime.    . carbamazepine (CARBATROL) 100 MG 12 hr capsule Take 1 capsule (100 mg total) by mouth at bedtime. This replaces previous carbamazepine (carbatrol) 100mg  ER prescription. 90 capsule 4  . carbamazepine (CARBATROL) 300 MG 12 hr capsule Take 1 capsule (300 mg total) by mouth 2 (two) times daily. 180 capsule 4   No facility-administered medications prior to visit.    PAST MEDICAL HISTORY: Past Medical History:  Diagnosis Date  . Acid reflux   .  Seizures ([place])     PAST SURGICAL HISTORY: Past Surgical History:  Procedure Laterality Date  . COLONOSCOPY N/A 01/08/2016   Procedure: COLONOSCOPY;  Surgeon: Daneil Dolin, MD;  [facility];  Service: Endoscopy;  Laterality: N/A;  1030   . None to date (12/25/15)      FAMILY HISTORY: Family History  Problem Relation Age of Onset  . Colon cancer Neg Hx     SOCIAL HISTORY: Social History   Socioeconomic  History  . Marital status: Married    Spouse name: [name]  . Number of children: 3  . Years of education: 12+  . Highest education level: Not on file  Occupational History    Employer: [employer]  Tobacco Use  . Smoking status: Never Smoker  . Smokeless tobacco: Never Used  Substance and Sexual Activity  . Alcohol use: No  . Drug use: No  . Sexual activity: Not on file  Other Topics Concern  . Not on file  Social History Narrative   Patient lives at home with wife Francisco [name].    Patient have 3 children.    Patient has some college.    Patient is currently working at [employer].    Patient is right handed.    Social Determinants of Health   Financial Resource Strain: Not on file  Food Insecurity: Not on file  Transportation Needs: Not on file  Physical Activity: Not on file  Stress: Not on file  Social Connections: Not on file  Intimate Partner Violence: Not on file   PHYSICAL EXAM  Vitals:   08/07/20 0812  BP: 131/83  Pulse: 69  Weight: 245 lb (111.1 kg)  Height: 6' (1.829 m)   Body mass index is 33.23 kg/m.  Generalized: Well developed, in no acute distress   Neurological examination  Mentation: Alert oriented to time, place, history taking. Follows all commands speech and language fluent Cranial nerve II-XII: Pupils were equal round reactive to light. Extraocular movements were full, visual field were full on confrontational test. Facial sensation and strength were normal. Head turning and shoulder shrug  were normal and symmetric. Motor: The motor testing reveals 5 over 5 strength of all 4 extremities. Good symmetric motor tone is noted throughout.  Sensory: Sensory testing is intact to soft touch on all 4 extremities. No evidence of extinction is noted.  Coordination: Cerebellar testing reveals good finger-nose-finger and heel-to-shin bilaterally.  Gait and station: Gait is normal.  Reflexes: Deep tendon reflexes are symmetric and normal bilaterally.    DIAGNOSTIC DATA (LABS, IMAGING, TESTING) - I reviewed patient records, labs, notes, testing and imaging myself where available.  Lab Results  Component Value Date   WBC 6.3 08/08/2019   HGB 14.7 08/08/2019   HCT 42.5 08/08/2019   MCV 95 08/08/2019   PLT 185 08/08/2019      Component Value Date/Time   NA 138 08/08/2019 0904   K 4.3 08/08/2019 0904   CL 102 08/08/2019 0904   CO2 25 08/08/2019 0904   GLUCOSE 99 08/08/2019 0904   GLUCOSE 148 (H) 03/16/2011 0105   BUN 10 08/08/2019 0904   CREATININE 0.93 08/08/2019 0904   CALCIUM 9.1 08/08/2019 0904   PROT 7.1 08/08/2019 0904   ALBUMIN 4.6 08/08/2019 0904   AST 25 08/08/2019 0904   ALT 27 08/08/2019 0904   ALKPHOS 106 08/08/2019 0904   BILITOT 0.3 08/08/2019 0904   GFRNONAA 99 08/08/2019 0904   GFRAA 114 08/08/2019 0904   No results  found for: CHOL, HDL, LDLCALC, LDLDIRECT, TRIG, CHOLHDL No results found for: HGBA1C No results found for: VITAMINB12 No results found for: TSH    ASSESSMENT AND PLAN 45 y.o. year old male  has a past medical history of Acid reflux and Seizures ([place]). here with:  1.  Seizures, well controlled  -Continues to do well, seizures are well controlled -Check routine labs today -Continue current dose of Carbatrol  -Call for seizure activity, otherwise follow-up 1 year [name] sooner if needed, okay for 15 min VV   I spent 20 minutes of face-to-face and non-face-to-face time with patient.  This included previsit chart review, lab review, study review, order entry, electronic health record documentation, patient education.  Butler Denmark, AGNP-C, DNP 08/07/2020, 8:33 AM [facility] [address] [place], [place] [address] [phone]

## 2020-08-07 ENCOUNTER — Encounter: Payer: Self-pay | Admitting: Neurology

## 2020-08-07 ENCOUNTER — Ambulatory Visit (INDEPENDENT_AMBULATORY_CARE_PROVIDER_SITE_OTHER): Payer: Commercial Managed Care - PPO | Admitting: Neurology

## 2020-08-07 VITALS — BP 131/83 | HR 69 | Ht 72.0 in | Wt 245.0 lb

## 2020-08-07 DIAGNOSIS — G40309 Generalized idiopathic epilepsy and epileptic syndromes, not intractable, without status epilepticus: Secondary | ICD-10-CM | POA: Diagnosis not present

## 2020-08-07 MED ORDER — CARBAMAZEPINE ER 100 MG PO CP12: 100.0000 mg | ORAL_CAPSULE | Freq: Every day | ORAL | 4 refills | Status: DC

## 2020-08-07 MED ORDER — CARBAMAZEPINE ER 300 MG PO CP12: 300.0000 mg | ORAL_CAPSULE | Freq: Two times a day (BID) | ORAL | 4 refills | Status: DC

## 2020-08-07 NOTE — Progress Notes (Signed)
I have read the note, and I agree with the clinical assessment and plan.  Mukund Weinreb K Dimitri Dsouza   

## 2020-08-07 NOTE — Patient Instructions (Signed)
Check labs today  Continue current medications  Call for seizure activity  See you back in 1 year

## 2020-08-08 ENCOUNTER — Telehealth: Payer: Self-pay

## 2020-08-08 LAB — COMPREHENSIVE METABOLIC PANEL
ALT: 27 IU/L (ref 0–44)
AST: 27 IU/L (ref 0–40)
Albumin/Globulin Ratio: 1.6 (ref 1.2–2.2)
Albumin: 4.6 g/dL (ref 4.0–5.0)
Alkaline Phosphatase: 92 IU/L (ref 44–121)
BUN/Creatinine Ratio: 17 (ref 9–20)
BUN: 15 mg/dL (ref 6–24)
Bilirubin Total: <0.2 mg/dL (ref 0.0–1.2)
CO2: 22 mmol/L (ref 20–29)
Calcium: 9.3 mg/dL (ref 8.7–10.2)
Chloride: 103 mmol/L (ref 96–106)
Creatinine, Ser: 0.89 mg/dL (ref 0.76–1.27)
Globulin, Total: 2.8 g/dL (ref 1.5–4.5)
Glucose: 107 mg/dL — ABNORMAL HIGH (ref 65–99)
Potassium: 4.8 mmol/L (ref 3.5–5.2)
Sodium: 141 mmol/L (ref 134–144)
Total Protein: 7.4 g/dL (ref 6.0–8.5)
eGFR: 107 mL/min/{1.73_m2} (ref >59)

## 2020-08-08 LAB — CBC WITH DIFFERENTIAL/PLATELET
Basophils Absolute: 0 10*3/uL (ref 0.0–0.2)
Basos: 1 %
EOS (ABSOLUTE): 0.1 10*3/uL (ref 0.0–0.4)
Eos: 2 %
Hematocrit: 45.1 % (ref 37.5–51.0)
Hemoglobin: 15.3 g/dL (ref 13.0–17.7)
Immature Grans (Abs): 0 10*3/uL (ref 0.0–0.1)
Immature Granulocytes: 0 %
Lymphocytes Absolute: 1.7 10*3/uL (ref 0.7–3.1)
Lymphs: 28 %
MCH: 32.4 pg (ref 26.6–33.0)
MCHC: 33.9 g/dL (ref 31.5–35.7)
MCV: 96 fL (ref 79–97)
Monocytes Absolute: 0.4 10*3/uL (ref 0.1–0.9)
Monocytes: 6 %
Neutrophils Absolute: 3.8 10*3/uL (ref 1.4–7.0)
Neutrophils: 63 %
Platelets: 190 10*3/uL (ref 150–450)
RBC: 4.72 x10E6/uL (ref 4.14–5.80)
RDW: 12.4 % (ref 11.6–15.4)
WBC: 6 10*3/uL (ref 3.4–10.8)

## 2020-08-08 LAB — CARBAMAZEPINE LEVEL, TOTAL: Carbamazepine (Tegretol), S: 9.2 ug/mL (ref 4.0–12.0)

## 2020-08-08 NOTE — Telephone Encounter (Signed)
Pt verified by name and DOB,  normal results given per provider, pt voiced understanding all question answered. °

## 2020-08-08 NOTE — Telephone Encounter (Signed)
-----   Message from Suzzanne Cloud, NP sent at 08/08/2020  9:40 AM EDT ----- Please call the patient, blood work is unremarkable, continue with current dosing.  Please call for any problems or concerns. See him next year!

## 2020-08-08 NOTE — Telephone Encounter (Signed)
-----   Message from Francisco Cloud, NP sent at 08/08/2020  9:40 AM EDT ----- Please call the patient, blood work is unremarkable, continue with current dosing.  Please call for any problems or concerns. See him next year!

## 2020-08-08 NOTE — Telephone Encounter (Signed)
Attempted to call pt, LVM for results per DPR. Ask pt to call back

## 2020-12-02 ENCOUNTER — Encounter: Payer: Self-pay | Admitting: *Deleted

## 2021-01-28 ENCOUNTER — Encounter: Payer: Self-pay | Admitting: *Deleted

## 2021-03-20 ENCOUNTER — Encounter: Payer: Self-pay | Admitting: *Deleted

## 2021-03-20 ENCOUNTER — Other Ambulatory Visit: Payer: Self-pay

## 2021-03-20 ENCOUNTER — Ambulatory Visit (INDEPENDENT_AMBULATORY_CARE_PROVIDER_SITE_OTHER): Payer: Self-pay | Admitting: *Deleted

## 2021-03-20 VITALS — Ht 72.0 in | Wt 225.0 lb

## 2021-03-20 DIAGNOSIS — Z86010 Personal history of colonic polyps: Secondary | ICD-10-CM

## 2021-03-20 MED ORDER — NA SULFATE-K SULFATE-MG SULF 17.5-3.13-1.6 GM/177ML PO SOLN: 1.0000 | Freq: Once | ORAL | 0 refills | Status: AC

## 2021-03-20 NOTE — Progress Notes (Addendum)
Gastroenterology Pre-Procedure Review  Request Date: 03/20/2021 Requesting Physician: 5 year recall, Last TCS 01/08/2016 done by Dr. Gala Romney, tubular adenoma  PATIENT REVIEW QUESTIONS: The patient responded to the following health history questions as indicated:    1. Diabetes Melitis: no 2. Joint replacements in the past 12 months: no 3. Major health problems in the past 3 months: no 4. Has an artificial valve or MVP: no 5. Has a defibrillator: no 6. Has been advised in past to take antibiotics in advance of a procedure like teeth cleaning: no 7. Family history of colon cancer: no  8. Alcohol Use: no 9. Illicit drug Use: no 10. History of sleep apnea: no  11. History of coronary artery or other vascular stents placed within the last 12 months: no 12. History of any prior anesthesia complications: no 13. Body mass index is 30.52 kg/m.    MEDICATIONS & ALLERGIES:    Patient reports the following regarding taking any blood thinners:   Plavix? no Aspirin? no Coumadin? no Brilinta? no Xarelto? no Eliquis? no Pradaxa? no Savaysa? no Effient? no  Patient confirms/reports the following medications:  Current Outpatient Medications  Medication Sig Dispense Refill   carbamazepine (CARBATROL) 100 MG 12 hr capsule Take 1 capsule (100 mg total) by mouth at bedtime. This replaces previous carbamazepine (carbatrol) 100mg  ER prescription. 90 capsule 4   carbamazepine (CARBATROL) 300 MG 12 hr capsule Take 1 capsule (300 mg total) by mouth 2 (two) times daily. 180 capsule 4   Multiple Vitamins-Minerals (QC MENS DAILY MULTIVITAMIN PO) Take 1 tablet by mouth as needed.     No current facility-administered medications for this visit.    Patient confirms/reports the following allergies:  No Known Allergies  No orders of the defined types were placed in this encounter.   AUTHORIZATION INFORMATION Primary Insurance: [place],  [place] #: [id number],  Group #: [id number] Pre-Cert / Josem Kaufmann required: No, not  required per Efraim Kaufmann / Auth #: Ref#: [id number]  SCHEDULE INFORMATION: Procedure has been scheduled as follows:  Date: 04/30/2021, Time: 12:00  Location: [facility] with Dr. Gala Romney  This Gastroenterology Pre-Precedure Review Form is being routed to the following provider(s): Aliene Altes, PA-C

## 2021-03-20 NOTE — Progress Notes (Signed)
Okay to schedule.  ASA 2.  Be sure to take seizure medication day of procedure.

## 2021-03-21 ENCOUNTER — Encounter: Payer: Self-pay | Admitting: *Deleted

## 2021-03-21 NOTE — Progress Notes (Signed)
Spoke to pt.  Informed him to be sure to take seizure medication day of procedure.  Pt voiced understanding.  Mailed prep instructions and reminder letter to pt.

## 2021-03-24 ENCOUNTER — Other Ambulatory Visit: Payer: Self-pay | Admitting: *Deleted

## 2021-03-24 NOTE — Progress Notes (Signed)
Spoke to Francisco [name] at [facility].  She informed me that no pre-cert is required.  No age criteria for diagnostic procedures.  Age 45 criteria for screening colonoscopies.  REF#: [id number]

## 2021-03-25 ENCOUNTER — Telehealth: Payer: Self-pay | Admitting: Internal Medicine

## 2021-03-25 NOTE — Telephone Encounter (Signed)
Routing to triage nurse.

## 2021-03-25 NOTE — Telephone Encounter (Signed)
Patient needs to reschedule his procedure

## 2021-03-25 NOTE — Telephone Encounter (Signed)
Lmom for pt to call back. 

## 2021-03-27 NOTE — Telephone Encounter (Signed)
Spoke to pt.  He can't do procedure on 04/30/2021 due to inventory at work.  He is willing to do any other day if we get any cancellations in December.  Pt made aware that right now our first available with Dr. Gala Romney would be in Jan.  However, we are awaiting for those schedules to be released.  Pt made aware that I will call him if any cancellations before then.  Pt voiced understanding.  Made Kim in Endo aware.

## 2021-04-09 ENCOUNTER — Encounter: Payer: Self-pay | Admitting: *Deleted

## 2021-04-09 NOTE — Telephone Encounter (Signed)
Called pt due to a cancellation in Dec.  Wanted to see if pt would like it.  LMOM for pt to call me back.

## 2021-04-09 NOTE — Telephone Encounter (Signed)
Pt called back.  Rescheduled procedure to 04/16/2021 at 7:30 with arrival at 6:30.  Pt requested new prep instructions to be sent to [email or web address].  Emailed accordingly.  Called [employer] in Endo and made her aware of new procedure date.

## 2021-04-14 ENCOUNTER — Telehealth: Payer: Self-pay | Admitting: Internal Medicine

## 2021-04-14 NOTE — Telephone Encounter (Signed)
Tried to call [employer] x 4 times (busy signal each time).  Called pt and informed him that RX was sent on 03/20/2021.  He is going to try to call them later and get them to look back in their data base.  Pt will call me if any problems.

## 2021-04-14 NOTE — Telephone Encounter (Signed)
PATIENT PHARMACY DOES NOT HAVE HIS PRESCRIPTION

## 2021-04-16 ENCOUNTER — Other Ambulatory Visit: Payer: Self-pay

## 2021-04-16 ENCOUNTER — Ambulatory Visit (HOSPITAL_COMMUNITY)
Admission: RE | Admit: 2021-04-16 | Discharge: 2021-04-16 | Disposition: A | Discharge status: Home / Self Care | Payer: Commercial Managed Care - PPO | Attending: Internal Medicine | Admitting: Internal Medicine

## 2021-04-16 ENCOUNTER — Encounter (HOSPITAL_COMMUNITY)
Admission: RE | Disposition: A | Discharge status: Home / Self Care | Payer: Self-pay | Source: Home / Self Care | Attending: Internal Medicine

## 2021-04-16 ENCOUNTER — Encounter (HOSPITAL_COMMUNITY): Payer: Self-pay | Admitting: Internal Medicine

## 2021-04-16 DIAGNOSIS — Z1211 Encounter for screening for malignant neoplasm of colon: Secondary | ICD-10-CM | POA: Insufficient documentation

## 2021-04-16 DIAGNOSIS — K5730 Diverticulosis of large intestine without perforation or abscess without bleeding: Secondary | ICD-10-CM | POA: Diagnosis not present

## 2021-04-16 DIAGNOSIS — Z86010 Personal history of colonic polyps: Secondary | ICD-10-CM | POA: Insufficient documentation

## 2021-04-16 HISTORY — PX: COLONOSCOPY: SHX5424

## 2021-04-16 SURGERY — COLONOSCOPY
Anesthesia: Moderate Sedation

## 2021-04-16 MED ORDER — MIDAZOLAM HCL 5 MG/5ML IJ SOLN
INTRAMUSCULAR | Status: DC | PRN
  Administered 2021-04-16 (×2): 2 mg via INTRAVENOUS
  Administered 2021-04-16 (×2): 1 mg via INTRAVENOUS

## 2021-04-16 MED ORDER — ONDANSETRON HCL 4 MG/2ML IJ SOLN
INTRAMUSCULAR | Status: DC | PRN
  Administered 2021-04-16: 4 mg via INTRAVENOUS

## 2021-04-16 MED ORDER — SODIUM CHLORIDE 0.9 % IV SOLN: INTRAVENOUS | Status: DC

## 2021-04-16 MED ORDER — ONDANSETRON HCL 4 MG/2ML IJ SOLN
INTRAMUSCULAR | Status: AC
  Filled 2021-04-16: qty 2

## 2021-04-16 MED ORDER — MEPERIDINE HCL 100 MG/ML IJ SOLN
INTRAMUSCULAR | Status: DC | PRN
  Administered 2021-04-16: 40 mg via INTRAVENOUS
  Administered 2021-04-16: 10 mg via INTRAVENOUS

## 2021-04-16 MED ORDER — MEPERIDINE HCL 50 MG/ML IJ SOLN
INTRAMUSCULAR | Status: AC
  Filled 2021-04-16: qty 1

## 2021-04-16 MED ORDER — MIDAZOLAM HCL 5 MG/5ML IJ SOLN
INTRAMUSCULAR | Status: AC
  Filled 2021-04-16: qty 10

## 2021-04-16 NOTE — H&P (Signed)
@LOGO @   Primary Care Physician:  Sharilyn Sites, MD Primary Gastroenterologist:  Dr. Gala Romney  Pre-Procedure History & Physical: HPI:  Francisco [name] is a 45 y.o. male here for surveillance colonoscopy.  History of multiple colonic adenomas removed his colon 2017.  Colonic diverticulosis. No bowel symptoms.  Past Medical History:  Diagnosis Date   Acid reflux    Seizures ([place])     Past Surgical History:  Procedure Laterality Date   COLONOSCOPY N/A 01/08/2016   Procedure: COLONOSCOPY;  Surgeon: Daneil Dolin, MD;  Location: [facility];  Service: Endoscopy;  Laterality: N/A;  1030    None to date (12/25/15)      Prior to Admission medications   Medication Sig Start Date End Date Taking? Authorizing Provider  carbamazepine (CARBATROL) 100 MG 12 hr capsule Take 1 capsule (100 mg total) by mouth at bedtime. This replaces previous carbamazepine (carbatrol) 100mg  ER prescription. 08/07/20  Yes Suzzanne Cloud, NP  carbamazepine (CARBATROL) 300 MG 12 hr capsule Take 1 capsule (300 mg total) by mouth 2 (two) times daily. 08/07/20  Yes Suzzanne Cloud, NP  omeprazole (PRILOSEC OTC) 20 MG tablet Take 20 mg by mouth daily as needed (acid reflux).   Yes [provider]  Multiple Vitamins-Minerals (QC MENS DAILY MULTIVITAMIN PO) Take 1 tablet by mouth daily.    [provider]    Allergies as of 03/21/2021   (No Known Allergies)    Family History  Problem Relation Age of Onset   Colon cancer Neg Hx     Social History   Socioeconomic History   Marital status: Married    Spouse name: [name]   Number of children: 3   Years of education: 12+   Highest education level: Not on file  Occupational History    Employer: [employer]  Tobacco Use   Smoking status: Never   Smokeless tobacco: Never  Vaping Use   Vaping Use: Never used  Substance and Sexual Activity   Alcohol use: No   Drug use: No   Sexual activity: Not on file  Other Topics Concern   Not on file   Social History Narrative   Patient lives at home with wife [name].    Patient have 3 children.    Patient has some college.    Patient is currently working at [employer].    Patient is right handed.    Social Determinants of Health   Financial Resource Strain: Not on file  Food Insecurity: Not on file  Transportation Needs: Not on file  Physical Activity: Not on file  Stress: Not on file  Social Connections: Not on file  Intimate Partner Violence: Not on file    Review of Systems: See HPI, otherwise negative ROS  Physical Exam: BP (!) 133/91   Pulse 67   Temp 97.7 F (36.5 C) (Oral)   Resp 15   Ht 6' (1.829 m)   Wt 102.1 kg   SpO2 96%   BMI 30.52 kg/m  General:   Alert,  Well-developed, well-nourished, pleasant and cooperative in NAD Neck:  Supple; no masses or thyromegaly. No significant cervical adenopathy. Lungs:  Clear throughout to auscultation.   No wheezes, crackles, or rhonchi. No acute distress. Heart:  Regular rate and rhythm; no murmurs, clicks, rubs,  or gallops. Abdomen: Non-distended, normal bowel sounds.  Soft and nontender without appreciable mass or hepatosplenomegaly.  Pulses:  Normal pulses noted. Extremities:  Without clubbing or edema.  Impression/Plan: 45 year old gentleman with  history colonic adenomas here for surveillance colonoscopy. The risks, benefits, limitations, alternatives and imponderables have been reviewed with the patient. Questions have been answered. All parties are agreeable.       Notice: This dictation was prepared with Dragon dictation along with smaller phrase technology. Any transcriptional errors that result from this process are unintentional and may not be corrected upon review.

## 2021-04-16 NOTE — Discharge Instructions (Signed)
  Colonoscopy Discharge Instructions  Read the instructions outlined below and refer to this sheet in the next few weeks. These discharge instructions provide you with general information on caring for yourself after you leave the hospital. Your doctor may also give you specific instructions. While your treatment has been planned according to the most current medical practices available, unavoidable complications occasionally occur. If you have any problems or questions after discharge, call Dr. Gala Romney at [phone]. ACTIVITY You may resume your regular activity, but move at a slower pace for the next 24 hours.  Take frequent rest periods for the next 24 hours.  Walking will help get rid of the air and reduce the bloated feeling in your belly (abdomen).  No driving for 24 hours (because of the medicine (anesthesia) used during the test).   Do not sign any important legal documents or operate any machinery for 24 hours (because of the anesthesia used during the test).  NUTRITION Drink plenty of fluids.  You may resume your normal diet as instructed by your doctor.  Begin with a light meal and progress to your normal diet. Heavy or fried foods are harder to digest and may make you feel sick to your stomach (nauseated).  Avoid alcoholic beverages for 24 hours or as instructed.  MEDICATIONS You may resume your normal medications unless your doctor tells you otherwise.  WHAT YOU CAN EXPECT TODAY Some feelings of bloating in the abdomen.  Passage of more gas than usual.  Spotting of blood in your stool or on the toilet paper.  IF YOU HAD POLYPS REMOVED DURING THE COLONOSCOPY: No aspirin products for 7 days or as instructed.  No alcohol for 7 days or as instructed.  Eat a soft diet for the next 24 hours.  FINDING OUT THE RESULTS OF YOUR TEST Not all test results are available during your visit. If your test results are not back during the visit, make an appointment with your caregiver to find out the  results. Do not assume everything is normal if you have not heard from your caregiver or the medical facility. It is important for you to follow up on all of your test results.  SEEK IMMEDIATE MEDICAL ATTENTION IF: You have more than a spotting of blood in your stool.  Your belly is swollen (abdominal distention).  You are nauseated or vomiting.  You have a temperature over 101.  You have abdominal pain or discomfort that is severe or gets worse throughout the day.    No polyps found today  Diverticulosis information provided  Repeat colonoscopy in 7 years recommended  At patient request, I called Mackenzy Eisenberg [phone] -reviewed findings and recommendations

## 2021-04-16 NOTE — Op Note (Signed)
[facility] Patient Name: Francisco [name] Procedure Date: 04/16/2021 7:21 AM MRN: [id number] Date of Birth: 1976-01-22 Attending MD: Francisco [name] , MD CSN: [id number] Age: 45 Admit Type: Outpatient Procedure:                Colonoscopy Indications:              High risk colon cancer surveillance: Personal                            history of colonic polyps Providers:                Francisco Richards, MD, Caprice Kluver, Randa Spike, Technician Referring MD:              Medicines:                Propofol per Anesthesia Complications:            No immediate complications. Estimated Blood Loss:     Estimated blood loss: none. Procedure:                Pre-Anesthesia Assessment:                           - Prior to the procedure, a History and Physical                            was performed, and patient medications and                            allergies were reviewed. The patient's tolerance of                            previous anesthesia was also reviewed. The risks                            and benefits of the procedure and the sedation                            options and risks were discussed with the patient.                            All questions were answered, and informed consent                            was obtained. Prior Anticoagulants: The patient has                            taken no previous anticoagulant or antiplatelet                            agents. ASA Grade Assessment: II - A patient with  mild systemic disease. After reviewing the risks                            and benefits, the patient was deemed in                            satisfactory condition to undergo the procedure.                           After obtaining informed consent, the colonoscope                            was passed under direct vision. Throughout the                            procedure, the patient's blood  pressure, pulse, and                            oxygen saturations were monitored continuously. The                            [id number]) scope was introduced through the                            anus and advanced to the the cecum, identified by                            appendiceal orifice and ileocecal valve. The                            colonoscopy was performed without difficulty. The                            patient tolerated the procedure well. The quality                            of the bowel preparation was adequate. The entire                            colon was well visualized. The ileocecal valve,                            appendiceal orifice, and rectum were photographed. Scope In: 8:10:27 AM Scope Out: 8:22:55 AM Scope Withdrawal Time: 0 hours 10 minutes 6 seconds  Total Procedure Duration: 0 hours 12 minutes 28 seconds  Findings:      The perianal and digital rectal examinations were normal.      Scattered medium-mouthed diverticula were found in the sigmoid colon and       descending colon.      The exam was otherwise without abnormality on direct and retroflexion       views. Impression:               - Diverticulosis in the sigmoid colon and in the  descending colon.                           - The examination was otherwise normal on direct                            and retroflexion views.                           - No specimens collected. Moderate Sedation:      Moderate (conscious) sedation was administered by the endoscopy nurse       and supervised by the endoscopist. The following parameters were       monitored: oxygen saturation, heart rate, blood pressure, respiratory       rate, EKG, adequacy of pulmonary ventilation, and response to care.       Total physician intraservice time was 22 minutes. Recommendation:           - Patient has a contact number available for                            emergencies. The signs  and symptoms of potential                            delayed complications were discussed with the                            patient. Return to normal activities tomorrow.                            Written discharge instructions were provided to the                            patient.                           - Resume previous diet.                           - Repeat colonoscopy in 7 years for surveillance.                           - Return to GI office (date not yet determined). Procedure Code(s):        --- Professional ---                           [phone], Colonoscopy, flexible; diagnostic, including                            collection of specimen(s) by brushing or washing,                            when performed (separate procedure)                           G0500, Moderate sedation services provided by the  same physician or other qualified health care                            professional performing a gastrointestinal                            endoscopic service that sedation supports,                            requiring the presence of an independent trained                            observer to assist in the monitoring of the                            patient's level of consciousness and physiological                            status; initial 15 minutes of intra-service time;                            patient age 93 years or older (additional time may                            be reported with [phone], as appropriate) Diagnosis Code(s):        --- Professional ---                           Z86.010, Personal history of colonic polyps                           K57.30, Diverticulosis of large intestine without                            perforation or abscess without bleeding CPT copyright 2019 American Medical Association. All rights reserved. The codes documented in this report are preliminary and upon coder review may  be revised to meet  current compliance requirements. Francisco [name]. Francisco Billy, MD Francisco Richards, MD 04/16/2021 8:44:52 AM This report has been signed electronically. Number of Addenda: 0

## 2021-04-21 ENCOUNTER — Encounter (HOSPITAL_COMMUNITY): Payer: Self-pay | Admitting: Internal Medicine

## 2021-08-11 ENCOUNTER — Telehealth: Payer: Commercial Managed Care - PPO | Admitting: Neurology

## 2021-08-13 ENCOUNTER — Telehealth: Payer: Self-pay | Admitting: Neurology

## 2021-08-13 NOTE — Progress Notes (Deleted)
Virtual Visit via Video Note ? ?I connected with Francisco [name] on 08/13/21 at  2:00 PM EDT by a video enabled telemedicine application and verified that I am speaking with the correct person using two identifiers. ? ?Location: ?Patient: *** ?Provider: *** ?  ?I discussed the limitations of evaluation and management by telemedicine and the availability of in person appointments. The patient expressed understanding and agreed to proceed. ? ?History of Present Illness: ?Francisco [name] here today for follow-up. ? ?08/07/2020 SS: Francisco [name] is a 46 year old male with history of seizure disorder.  Seizures have been well controlled since 1999 when Carbatrol was initiated.  Last seizure was in January 2014 when he missed a dose. 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  Often has difficulty getting this from the pharmacy, so doesn't take. He knows his triggers for these feelings: Stress, sleep deprivation, missing a dose.  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  Here today for evaluation unaccompanied. ?  ?Observations/Objective: ? ? ?Assessment and Plan: ?1.  Seizures ? ?Follow Up Instructions: ? ?  ?I discussed the assessment and treatment plan with the patient. The patient was provided an opportunity to ask questions and all were answered. The patient agreed with the plan and demonstrated an understanding of the instructions. ?  ?The patient was advised to call back [name] seek an in-person evaluation if the symptoms worsen [name] if the condition fails to improve as anticipated. ? ? ?Butler Denmark, AGNP-C, DNP  ?[facility] ?[address], [address] ?[place], [place] [address] ?[phone] ? ?

## 2021-08-19 ENCOUNTER — Other Ambulatory Visit: Payer: Self-pay

## 2021-08-19 MED ORDER — CARBAMAZEPINE ER 100 MG PO CP12: 100.0000 mg | ORAL_CAPSULE | Freq: Every day | ORAL | 0 refills | Status: DC

## 2021-08-19 NOTE — Progress Notes (Signed)
Rx refilled. Pt needs appointment for further refills. ?

## 2021-08-26 ENCOUNTER — Telehealth: Payer: Self-pay | Admitting: Neurology

## 2021-08-26 MED ORDER — CARBAMAZEPINE ER 300 MG PO CP12: 300.0000 mg | ORAL_CAPSULE | Freq: Two times a day (BID) | ORAL | 1 refills | Status: DC

## 2021-08-26 NOTE — Telephone Encounter (Signed)
Pt wife [name], on [place]) claims [facility] has called [facility] office regarding carbamazepine (CARBATROL) 300 MG 12 hr capsule. ?Pt has taken his last pill. ?[facility] advised pt to call [place] office requesting refill of carbamazepine (CARBATROL) 300 MG 12 hr capsule. ? ?[place] #[phone]  ?

## 2021-08-26 NOTE — Telephone Encounter (Signed)
Rx refilled.

## 2021-09-16 NOTE — Progress Notes (Signed)
? ?  Virtual Visit via Video Note ? ?I connected with Francisco [name] on 09/16/21 at  2:15 PM EDT by a video enabled telemedicine application and verified that I am speaking with the correct person using two identifiers. ? ?Location: ?Patient: at his work ?Provider: in the office ?  ?I discussed the limitations of evaluation and management by telemedicine and the availability of in person appointments. The patient expressed understanding and agreed to proceed. ? ?History of Present Illness: ?09/17/2021 SS: Alanson is here today for follow-up via telephone visit, he did not have good Internet connection.  Seizures remain under excellent control on Carbatrol.  In March 2022 carbamazepine level was 9.2, CBC and CMP were normal. No new issues, tolerating medication well. Family is doing well.  ? ?08/07/2020 SS: Mr. Milks is a 46 year old male with history of seizure disorder.  Seizures have been well controlled since 1999 when Carbatrol was initiated.  Last seizure was in January 2014 when he missed a dose. 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  Often has difficulty getting this from the pharmacy, so doesn't take. He knows his triggers for these feelings: Stress, sleep deprivation, missing a dose.  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  Here today for evaluation unaccompanied. ?  ?Observations/Objective: ?Via telephone visit, alert and oriented, speech is clear, good historian ? ?Assessment and Plan: ?Seizures ?-Remain under excellent control ?-Continue carbamazepine 300 mg 12 hr capsule twice daily, 100 mg at bedtime ?-Call for any seizures ?-Labs were unremarkable last year, can check next visit ? ?Meds ordered this encounter  ?Medications  ? carbamazepine (CARBATROL) 300 MG 12 hr capsule  ?  Sig: Take 1 capsule (300 mg total) by mouth 2 (two) times daily.  ?  Dispense:  180  capsule  ?  Refill:  3  ? carbamazepine (CARBATROL) 100 MG 12 hr capsule  ?  Sig: Take 1 capsule (100 mg total) by mouth at bedtime. This replaces previous carbamazepine (carbatrol) '100mg'$  ER prescription.  ?  Dispense:  90 capsule  ?  Refill:  3  ? ? ? ?Follow Up Instructions: ?1 year ?  ?I discussed the assessment and treatment plan with the patient. The patient was provided an opportunity to ask questions and all were answered. The patient agreed with the plan and demonstrated an understanding of the instructions. ?  ?The patient was advised to call back [name] seek an in-person evaluation if the symptoms worsen [name] if the condition fails to improve as anticipated. ? ?I spent 12 minutes on this telephone visit.  ? ?Butler Denmark, AGNP-C, DNP  ?[facility] ?[address], [address] ?[place], [place] [address] ?[phone] ? ? ?

## 2021-09-17 ENCOUNTER — Telehealth: Payer: Self-pay | Admitting: Neurology

## 2021-09-17 ENCOUNTER — Encounter: Payer: Self-pay | Admitting: Neurology

## 2021-09-17 ENCOUNTER — Telehealth (INDEPENDENT_AMBULATORY_CARE_PROVIDER_SITE_OTHER): Payer: BC Managed Care – PPO | Admitting: Neurology

## 2021-09-17 DIAGNOSIS — G40319 Generalized idiopathic epilepsy and epileptic syndromes, intractable, without status epilepticus: Secondary | ICD-10-CM

## 2021-09-17 MED ORDER — CARBAMAZEPINE ER 300 MG PO CP12: 300.0000 mg | ORAL_CAPSULE | Freq: Two times a day (BID) | ORAL | 3 refills | Status: DC

## 2021-09-17 MED ORDER — CARBAMAZEPINE ER 100 MG PO CP12: 100.0000 mg | ORAL_CAPSULE | Freq: Every day | ORAL | 3 refills | Status: DC

## 2021-09-17 NOTE — Telephone Encounter (Signed)
Error

## 2021-10-16 NOTE — Progress Notes (Signed)
[facility] Note  10/20/2021     CHIEF COMPLAINT Patient presents for Retina Evaluation   HISTORY OF PRESENT ILLNESS: Francisco [name] is a 46 y.o. male who presents to the clinic today for:   HPI     Retina Evaluation   In both eyes.  Associated Symptoms Negative for Flashes, Floaters, Distortion, Blind Spot, Pain, Redness, Photophobia, Glare, Trauma, Scalp Tenderness, Jaw Claudication, Shoulder/Hip pain, Fever, Weight Loss and Fatigue.  Context:  near vision.  Treatments tried include no treatments.  I, the attending physician,  performed the HPI with the patient and updated documentation appropriately.        Comments   Patient states vision seems good OU. Referred by Dr. Posey Pronto from [facility] in [place] for retinal lesion near optic nerve. Patient can't remember which eye. Wears +1.25 for reading only.       Last edited by Bernarda Caffey, MD on 10/20/2021  9:09 AM.    Patient states he went in for a regular eye exam for reading glasses.  Patient has had epilepsy since he was 46 yrs old.  Last episode was about 5-7 years ago -- under control. He has been seeing Dr. Jannifer Franklin Neurologist.   Referring physician: Dr. Posey Pronto at [facility] [address] [place] (telemedicine visit) Alanson Puls, [facility] [address] [place],  [place] [address]   HISTORICAL INFORMATION:   Selected notes from the MEDICAL RECORD NUMBER Referred by Dr. Posey Pronto at [facility] [address] Linna Hoff LEE:  Ocular Hx- PMH-    CURRENT MEDICATIONS: No current outpatient medications on file. (Ophthalmic Drugs)   No current facility-administered medications for this visit. (Ophthalmic Drugs)   Current Outpatient Medications (Other)  Medication Sig   carbamazepine (CARBATROL) 100 MG 12 hr capsule Take 1 capsule (100 mg total) by mouth at bedtime. This replaces previous carbamazepine (carbatrol) '100mg'$  ER prescription.   Multiple Vitamins-Minerals (QC MENS DAILY  MULTIVITAMIN PO) Take 1 tablet by mouth daily.   omeprazole (PRILOSEC OTC) 20 MG tablet Take 20 mg by mouth daily as needed (acid reflux).   carbamazepine (CARBATROL) 300 MG 12 hr capsule Take 1 capsule (300 mg total) by mouth 2 (two) times daily.   No current facility-administered medications for this visit. (Other)   REVIEW OF SYSTEMS: ROS   Positive for: Neurological, Eyes Negative for: Constitutional, Gastrointestinal, Skin, Genitourinary, Musculoskeletal, HENT, Endocrine, Cardiovascular, Respiratory, Psychiatric, Allergic/Imm, Heme/Lymph Last edited by Roselee Nova D, COT on 10/20/2021  9:03 AM.     ALLERGIES No Known Allergies  PAST MEDICAL HISTORY Past Medical History:  Diagnosis Date   Acid reflux    Seizures ([place])    Past Surgical History:  Procedure Laterality Date   COLONOSCOPY N/A 01/08/2016   Procedure: COLONOSCOPY;  Surgeon: Daneil Dolin, MD;  [facility];  Service: Endoscopy;  Laterality: N/A;  1030    COLONOSCOPY N/A 04/16/2021   Procedure: COLONOSCOPY;  Surgeon: Daneil Dolin, MD;  [facility];  Service: Endoscopy;  Laterality: N/A;  12:00   None to date (12/25/15)     FAMILY HISTORY Family History  Problem Relation Age of Onset   Diabetes Mother    Colon cancer Neg Hx    SOCIAL HISTORY Social History   Tobacco Use   Smoking status: Never   Smokeless tobacco: Never  Vaping Use   Vaping Use: Never used  Substance Use Topics   Alcohol use: No   Drug use: No  OPHTHALMIC EXAM:  Base Eye Exam     Visual Acuity (Snellen - Linear)       Right Left   Dist [place] 20/20 20/20         Tonometry (Tonopen, 9:09 AM)       Right Left   Pressure 15 14         Pupils       Dark Light Shape React APD   Right 4 3 Round Brisk None   Left 4 3 Round Brisk None         Visual Fields (Counting fingers)       Left Right    Full Full         Extraocular Movement       Right Left    Full, Ortho Full, Ortho          Neuro/Psych     Oriented x3: Yes   Mood/Affect: Normal         Dilation     Both eyes: 1.0% Mydriacyl, 2.5% Phenylephrine @ 9:09 AM           Slit Lamp and Fundus Exam     External Exam       Right Left   External Normal Normal         Slit Lamp Exam       Right Left   Lids/Lashes Dermatochalasis - upper lid Dermatochalasis - upper lid   Conjunctiva/Sclera White and quiet Mild temporal and nasal Pinguecula   Cornea Clear Clear   Anterior Chamber Deep and clear Deep and clear   Iris Round and dilated Round and dilated   Lens Clear Clear   Anterior Vitreous Normal Normal         Fundus Exam       Right Left   Disc Pink and sharp Pink and sharp   C/D Ratio 0.2 0.2   Macula Flat, Good foveal reflex, RPE mottling, No heme, No edema Flat, Good foveal reflex, RPE mottling, No heme or edema   Vessels Normal Normal   Periphery Attached, No heme Attached, No heme, flat pigmented choroidal lesion nasal to disc 1x0.6DD aporx 1DD from disc -- no SRF, no orange pigment           Refraction     Manifest Refraction       Sphere Cylinder Axis Dist VA   Right -0.75 +0.50 015 20/20   Left -0.25 Sphere  20/20           IMAGING AND PROCEDURES  Imaging and Procedures for 10/20/2021  OCT, Retina - OU - Both Eyes       Right Eye Central Foveal Thickness: 277. Progression has no prior data. Findings include normal foveal contour, no IRF, no SRF, vitreomacular adhesion .   Left Eye Central Foveal Thickness: 277. Progression has no prior data. Findings include normal foveal contour, no IRF, no SRF, vitreomacular adhesion .   Notes *Images captured and stored on drive  Diagnosis / Impression:  NFP; no IRF/SRF OU  Clinical management:  See below  Abbreviations: NFP - Normal foveal profile. CME - cystoid macular edema. PED - pigment epithelial detachment. IRF - intraretinal fluid. SRF - subretinal fluid. EZ - ellipsoid zone. ERM - epiretinal membrane.  ORA - outer retinal atrophy. ORT - outer retinal tubulation. SRHM - subretinal hyper-reflective material. IRHM - intraretinal hyper-reflective material      Color Fundus Photography Optos - OU - Both Eyes  Right Eye Progression has no prior data. Disc findings include normal observations. Macula : normal observations. Vessels : normal observations. Periphery : normal observations.   Left Eye Progression has no prior data. Disc findings include normal observations. Macula : normal observations. Vessels : normal observations. Periphery : RPE abnormality (0.5 x 1 DD pigmented choroidal lesion nasal to disc).   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 Choroidal nevus of left eye  - flat pigmented choroidal lesion nasal to disc 1x0.6DD aporx 1DD from disc -- no SRF, no orange pigment  - pt asymptomatic with lesion found incidentally on routine eye exam  - baseline Optos images obtained 6.12.23 for future comparison  - discussed findings, prognosis - no indication for treatment at this time - monitor for now - f/u 1 year -- DFE/OCT, repeat Optos imaiging   Ophthalmic Meds Ordered this visit:  No orders of the defined types were placed in this encounter.    Return in about 1 year (around 10/21/2022) for DFE, OCT.  There are no Patient Instructions on file for this visit.   Explained the diagnoses, plan, and follow up with the patient and they expressed understanding.  Patient expressed understanding of the importance of proper follow up care.   This document serves as a record of services personally performed by Gardiner Sleeper, MD, PhD. It was created on their behalf by Roselee Nova, COMT. The creation of this  record is the provider's dictation and/or activities during the visit.  Electronically signed by: Roselee Nova, COMT 10/20/21 1:01 PM  Gardiner Sleeper, M.D., Ph.D. Diseases & Surgery of the Retina and Vitreous [facility]  I have reviewed the above documentation for accuracy and completeness, and I agree with the above. Gardiner Sleeper, M.D., Ph.D. 10/20/21 1:05 PM   Abbreviations: M myopia (nearsighted); A astigmatism; H hyperopia (farsighted); P presbyopia; Mrx spectacle prescription;  CTL contact lenses; OD right eye; OS left eye; OU both eyes  XT exotropia; ET esotropia; PEK punctate epithelial keratitis; PEE punctate epithelial erosions; DES dry eye syndrome; MGD meibomian gland dysfunction; ATs artificial tears; PFAT's preservative free artificial tears; [place] nuclear sclerotic cataract; PSC posterior subcapsular cataract; ERM epi-retinal membrane; PVD posterior vitreous detachment; RD retinal detachment; DM diabetes mellitus; DR diabetic retinopathy; NPDR non-proliferative diabetic retinopathy; PDR proliferative diabetic retinopathy; CSME clinically significant macular edema; DME diabetic macular edema; dbh dot blot hemorrhages; CWS cotton wool spot; POAG primary open angle glaucoma; C/D cup-to-disc ratio; HVF humphrey visual field; GVF goldmann visual field; OCT optical coherence tomography; IOP intraocular pressure; BRVO Branch retinal vein occlusion; CRVO central retinal vein occlusion; CRAO central retinal artery occlusion; BRAO branch retinal artery occlusion; RT retinal tear; SB scleral buckle; PPV pars plana vitrectomy; VH Vitreous hemorrhage; PRP panretinal laser photocoagulation; IVK intravitreal kenalog; VMT vitreomacular traction; MH Macular hole;  NVD neovascularization of the disc; NVE neovascularization elsewhere; AREDS age related eye disease study; ARMD age related macular degeneration; POAG primary open angle glaucoma; EBMD epithelial/anterior basement  membrane dystrophy; ACIOL anterior chamber intraocular lens; IOL intraocular lens; PCIOL posterior chamber intraocular lens; Phaco/IOL phacoemulsification with  intraocular lens placement; [place] photorefractive keratectomy; LASIK laser assisted in situ keratomileusis; HTN hypertension; DM diabetes mellitus; COPD chronic obstructive pulmonary disease

## 2021-10-20 ENCOUNTER — Encounter (INDEPENDENT_AMBULATORY_CARE_PROVIDER_SITE_OTHER): Payer: Self-pay | Admitting: Ophthalmology

## 2021-10-20 ENCOUNTER — Ambulatory Visit (INDEPENDENT_AMBULATORY_CARE_PROVIDER_SITE_OTHER): Payer: BC Managed Care – PPO | Admitting: Ophthalmology

## 2021-10-20 DIAGNOSIS — D3132 Benign neoplasm of left choroid: Secondary | ICD-10-CM

## 2022-02-25 ENCOUNTER — Telehealth: Payer: Self-pay | Admitting: Neurology

## 2022-02-25 MED ORDER — CARBAMAZEPINE ER 300 MG PO CP12: 300.0000 mg | ORAL_CAPSULE | Freq: Two times a day (BID) | ORAL | 0 refills | Status: DC

## 2022-02-25 MED ORDER — CARBAMAZEPINE ER 100 MG PO CP12: 100.0000 mg | ORAL_CAPSULE | Freq: Every day | ORAL | 0 refills | Status: DC

## 2022-02-25 NOTE — Telephone Encounter (Signed)
Refills has been sent to [place].   I have called the pt updated we have sent both the 100 and 300 mg of the Carbatrol. He will let us know of any issues picking the medication up.

## 2022-02-25 NOTE — Telephone Encounter (Signed)
Wife states pt is out of town and failed to pack his carbamazepine (CARBATROL) 300 MG 12 hr capsule, wife is asking if 12 pills could be called into [phone] .  Wife states pt missed his dose for last night and this morning.

## 2022-05-16 ENCOUNTER — Encounter: Payer: Self-pay | Admitting: Neurology

## 2022-08-31 ENCOUNTER — Other Ambulatory Visit: Payer: Self-pay | Admitting: Neurology

## 2022-09-16 ENCOUNTER — Telehealth: Payer: Self-pay | Admitting: Neurology

## 2022-09-16 NOTE — Telephone Encounter (Signed)
Mychart message need insurance

## 2022-09-23 ENCOUNTER — Telehealth (INDEPENDENT_AMBULATORY_CARE_PROVIDER_SITE_OTHER): Payer: No Typology Code available for payment source | Admitting: Neurology

## 2022-09-23 DIAGNOSIS — R569 Unspecified convulsions: Secondary | ICD-10-CM

## 2022-09-23 DIAGNOSIS — G40319 Generalized idiopathic epilepsy and epileptic syndromes, intractable, without status epilepticus: Secondary | ICD-10-CM

## 2022-09-23 MED ORDER — CARBAMAZEPINE ER 300 MG PO CP12: 300.0000 mg | ORAL_CAPSULE | Freq: Two times a day (BID) | ORAL | 3 refills | Status: DC

## 2022-09-23 MED ORDER — CARBAMAZEPINE ER 100 MG PO CP12: 100.0000 mg | ORAL_CAPSULE | Freq: Every day | ORAL | 3 refills | Status: DC

## 2022-09-23 NOTE — Patient Instructions (Signed)
Please come at your convenience for blood work, orders are in Continue current medications Let me know if you need anything, see you in 1 year!

## 2022-09-23 NOTE — Progress Notes (Signed)
   Virtual Visit via Video Note  I connected with Francisco [name] on 09/23/22 at  4:00 PM EDT by a video enabled telemedicine application and verified that I am speaking with the correct person using two identifiers.  Location: Patient: at his work Provider: in the office   I discussed the limitations of evaluation and management by telemedicine and the availability of in person appointments. The patient expressed understanding and agreed to proceed.  History of Present Illness: Today 09/23/22 SS: Here today via virtual visit.  Has continued to do well.  Last seizure was in January 2014 during a GI illness when he could not keep anything down.  He remains on Carbatrol 300 mg twice a day, has Carbatrol 100 mg at bedtime on days he feels near seizure event.  He has switched jobs working in [employer], less stress.  Most of his seizures have been at night.  No new problems or concerns.  09/17/2021 SS: Francisco [name] is here today for follow-up via telephone visit, he did not have good Internet connection.  Seizures remain under excellent control on Carbatrol.  In March 2022 carbamazepine level was 9.2, CBC and CMP were normal. No new issues, tolerating medication well. Family is doing well.   08/07/2020 SS: Mr. Francisco [name] is a 47 year old male with history of seizure disorder.  Seizures have been well controlled since 1999 when Carbatrol was initiated.  Last seizure was in January 2014 when he missed a dose. 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  Often has difficulty getting this from the pharmacy, so doesn't take. He knows his triggers for these feelings: Stress, sleep deprivation, missing a dose.  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  Here today for evaluation unaccompanied.   Observations/Objective: Via video visit, is alert and oriented, speech is clear and concise, facial  symmetry noted, moves about freely  Assessment and Plan: Seizures -Remain under excellent control -Continue carbamazepine 300 mg 12 hr capsule twice daily, 100 mg at bedtime as needed -I ordered routine labs, he will come by in the near future -Call for any seizures  Meds ordered this encounter  Medications   carbamazepine (CARBATROL) 300 MG 12 hr capsule    Sig: Take 1 capsule (300 mg total) by mouth 2 (two) times daily.    Dispense:  180 capsule    Refill:  3   carbamazepine (CARBATROL) 100 MG 12 hr capsule    Sig: Take 1 capsule (100 mg total) by mouth at bedtime. This replaces previous carbamazepine (carbatrol) 100mg  ER prescription.    Dispense:  90 capsule    Refill:  3     Follow Up Instructions: 1 year   I discussed the assessment and treatment plan with the patient. The patient was provided an opportunity to ask questions and all were answered. The patient agreed with the plan and demonstrated an understanding of the instructions.   The patient was advised to call back or seek an in-person evaluation if the symptoms worsen or if the condition fails to improve as anticipated.   Otila Kluver, DNP  [facility] [address] [place], [place] [address] [phone]

## 2023-01-13 ENCOUNTER — Other Ambulatory Visit: Payer: Self-pay

## 2023-01-13 MED ORDER — CARBAMAZEPINE ER 300 MG PO CP12: 300.0000 mg | ORAL_CAPSULE | Freq: Two times a day (BID) | ORAL | 3 refills | Status: DC

## 2023-01-13 MED ORDER — CARBAMAZEPINE ER 100 MG PO CP12: 100.0000 mg | ORAL_CAPSULE | Freq: Every day | ORAL | 3 refills | Status: DC

## 2023-09-29 ENCOUNTER — Telehealth: Payer: Self-pay | Admitting: Neurology

## 2023-09-29 NOTE — Progress Notes (Deleted)
   Virtual Visit via Video Note  I connected with Francisco [name] on 09/29/23 at  4:00 PM EDT by a video enabled telemedicine application and verified that I am speaking with the correct person using two identifiers.  Location: Patient: at his work Provider: in the office   I discussed the limitations of evaluation and management by telemedicine and the availability of in person appointments. The patient expressed understanding and agreed to proceed.  History of Present Illness: Update 09/29/23 SS:   Today 09/23/22 SS: Here today via virtual visit.  Has continued to do well.  Last seizure was in January 2014 during a GI illness when he could not keep anything down.  He remains on Carbatrol  300 mg twice a day, has Carbatrol  100 mg at bedtime on days he feels near seizure event.  He has switched jobs working in [employer], less stress.  Most of his seizures have been at night.  No new problems or concerns.  09/17/2021 SS: Francisco [name] is here today for follow-up via telephone visit, he did not have good Internet connection.  Seizures remain under excellent control on Carbatrol .  In March 2022 carbamazepine  level was 9.2, CBC and CMP were normal. No new issues, tolerating medication well. Family is doing well.   08/07/2020 SS: [name] is a 48 year old male with history of seizure disorder.  Seizures have been well controlled since 1999 when Carbatrol  was initiated.  Last seizure was in January 2014 when he missed a dose. 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  Often has difficulty getting this from the pharmacy, so doesn't take. He knows his triggers for these feelings: Stress, sleep deprivation, missing a dose.  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  Here today for evaluation unaccompanied.   Observations/Objective: Via video visit, is alert and oriented, speech is clear  and concise, facial symmetry noted, moves about freely  Assessment and Plan: Seizures -Remain under excellent control -Continue carbamazepine  300 mg 12 hr capsule twice daily, 100 mg at bedtime as needed -I ordered routine labs, he will come by in the near future -Call for any seizures  No orders of the defined types were placed in this encounter.    Follow Up Instructions: 1 year   I discussed the assessment and treatment plan with the patient. The patient was provided an opportunity to ask questions and all were answered. The patient agreed with the plan and demonstrated an understanding of the instructions.   The patient was advised to call back or seek an in-person evaluation if the symptoms worsen or if the condition fails to improve as anticipated.   Cortland Ding, DNP  [facility] [address], [address] [place], [place] [address] [phone]

## 2023-09-29 NOTE — Telephone Encounter (Signed)
 Patient was working on job site and didn't have internet connection for VV. Can we reschedule him for Friday June 6 at 845?

## 2023-09-29 NOTE — Telephone Encounter (Signed)
 Patient has been scheduled for an office visit with Sarah for 10/15/23 at 8:45am

## 2023-10-07 ENCOUNTER — Other Ambulatory Visit: Payer: Self-pay

## 2023-10-07 MED ORDER — CARBAMAZEPINE ER 300 MG PO CP12: 300.0000 mg | ORAL_CAPSULE | Freq: Two times a day (BID) | ORAL | 1 refills | Status: DC

## 2023-10-13 NOTE — Progress Notes (Unsigned)
 Patient: Francisco [name] Date of Birth: 09/05/1975  Reason for Visit: Follow up History from: Patient Primary Neurologist: [facility]  ASSESSMENT AND PLAN 48 y.o. year old male   Seizures  - Tramar is doing great.  Has not had a seizure in 10+ years - We will continue carbamazepine , refills were sent in - Check routine labs today - Advise follow-up with primary care for annual physical - Call for seizure activity, follow-up in 1 year  Meds ordered this encounter  Medications   carbamazepine  (CARBATROL ) 100 MG 12 hr capsule    Sig: Take 1 capsule (100 mg total) by mouth at bedtime. This replaces previous carbamazepine  (carbatrol ) 100mg  ER prescription.    Dispense:  90 capsule    Refill:  3   carbamazepine  (CARBATROL ) 300 MG 12 hr capsule    Sig: Take 1 capsule (300 mg total) by mouth 2 (two) times daily.    Dispense:  180 capsule    Refill:  3   Orders Placed This Encounter  Procedures   CBC with Differential/Platelet   CMP   Carbamazepine  level, total   Vitamin D, 25-hydroxy   HISTORY OF PRESENT ILLNESS: Today 10/15/23 No seizures, has been 10+ years since last seizures. In his life has had "bunch" of seizures. If he misses medication, will have seizure aura, like his tongue jumping and asthma attack.  Remains on carbamazepine  12 hr tablet 300 mg BID, will take the 100 mg if he is under a lot of stress, usually few times a month.  Has been doing well.  No health issues.  HISTORY  Today 09/23/22 SS: Here today via virtual visit.  Has continued to do well.  Last seizure was in January 2014 during a GI illness when he could not keep anything down.  He remains on Carbatrol  300 mg twice a day, has Carbatrol  100 mg at bedtime on days he feels near seizure event.  He has switched jobs working in [employer], less stress.  Most of his seizures have been at night.  No new problems or concerns.   REVIEW OF SYSTEMS: Out of a complete 14 system review of symptoms, the patient complains only  of the following symptoms, and all other reviewed systems are negative.  See HPI  ALLERGIES: No Known Allergies  HOME MEDICATIONS: Outpatient Medications Prior to Visit  Medication Sig Dispense Refill   Multiple Vitamins-Minerals (QC MENS DAILY MULTIVITAMIN PO) Take 1 tablet by mouth daily.     omeprazole (PRILOSEC OTC) 20 MG tablet Take 20 mg by mouth daily as needed (acid reflux).     carbamazepine  (CARBATROL ) 100 MG 12 hr capsule Take 1 capsule (100 mg total) by mouth at bedtime. This replaces previous carbamazepine  (carbatrol ) 100mg  ER prescription. 90 capsule 3   carbamazepine  (CARBATROL ) 300 MG 12 hr capsule Take 1 capsule (300 mg total) by mouth 2 (two) times daily. 180 capsule 1   No facility-administered medications prior to visit.    PAST MEDICAL HISTORY: Past Medical History:  Diagnosis Date   Acid reflux    Seizures ([facility])     PAST SURGICAL HISTORY: Past Surgical History:  Procedure Laterality Date   COLONOSCOPY N/A 01/08/2016   Procedure: COLONOSCOPY;  Surgeon: Suzette Espy, MD;  [facility];  Service: Endoscopy;  Laterality: N/A;  1030    COLONOSCOPY N/A 04/16/2021   Procedure: COLONOSCOPY;  Surgeon: Suzette Espy, MD;  [facility];  Service: Endoscopy;  Laterality: N/A;  12:00   None  to date (12/25/15)      FAMILY HISTORY: Family History  Problem Relation Age of Onset   Diabetes Mother    Colon cancer Neg Hx     SOCIAL HISTORY: Social History   Socioeconomic History   Marital status: Married    Spouse name: Isa Manuel   Number of children: 3   Years of education: 12+   Highest education level: Not on file  Occupational History    Employer: [employer]  Tobacco Use   Smoking status: Never   Smokeless tobacco: Never  Vaping Use   Vaping status: Never Used  Substance and Sexual Activity   Alcohol use: No   Drug use: No   Sexual activity: Not on file  Other Topics Concern   Not on file  Social History Narrative    Patient lives at home with wife Tracker Mance.    Patient have 3 children.    Patient has some college.    Patient is currently working at [employer].    Patient is right handed.    Social Drivers of Corporate investment banker Strain: Not on file  Food Insecurity: Not on file  Transportation Needs: Not on file  Physical Activity: Not on file  Stress: Not on file  Social Connections: Not on file  Intimate Partner Violence: Not on file    PHYSICAL EXAM  Vitals:   10/15/23 0832  BP: 116/78  Weight: 245 lb (111.1 kg)  Height: 6' (1.829 m)   Body mass index is 33.23 kg/m.  Generalized: Well developed, in no acute distress  Neurological examination  Mentation: Alert oriented to time, place, history taking. Follows all commands speech and language fluent Cranial nerve II-XII: Pupils were equal round reactive to light. Extraocular movements were full, visual field were full on confrontational test. Facial sensation and strength were normal. Head turning and shoulder shrug  were normal and symmetric. Motor: The motor testing reveals 5 over 5 strength of all 4 extremities. Good symmetric motor tone is noted throughout.  Sensory: Sensory testing is intact to soft touch on all 4 extremities. No evidence of extinction is noted.  Coordination: Cerebellar testing reveals good finger-nose-finger bilaterally Gait and station: Gait is normal.   DIAGNOSTIC DATA (LABS, IMAGING, TESTING) - I reviewed patient records, labs, notes, testing and imaging myself where available.  Lab Results  Component Value Date   WBC 6.0 08/07/2020   HGB 15.3 08/07/2020   HCT 45.1 08/07/2020   MCV 96 08/07/2020   PLT 190 08/07/2020      Component Value Date/Time   NA 141 08/07/2020 0835   K 4.8 08/07/2020 0835   CL 103 08/07/2020 0835   CO2 22 08/07/2020 0835   GLUCOSE 107 (H) 08/07/2020 0835   GLUCOSE 148 (H) 03/16/2011 0105   BUN 15 08/07/2020 0835   CREATININE 0.89 08/07/2020 0835   CALCIUM 9.3  08/07/2020 0835   PROT 7.4 08/07/2020 0835   ALBUMIN 4.6 08/07/2020 0835   AST 27 08/07/2020 0835   ALT 27 08/07/2020 0835   ALKPHOS 92 08/07/2020 0835   BILITOT <0.2 08/07/2020 0835   GFRNONAA 99 08/08/2019 0904   GFRAA 114 08/08/2019 0904   No results found for: "CHOL", "HDL", "LDLCALC", "LDLDIRECT", "TRIG", "CHOLHDL" No results found for: "HGBA1C" No results found for: "VITAMINB12" No results found for: "TSH"  Jeanmarie Millet, AGNP-C, DNP 10/15/2023, 8:53 AM [facility] [address], [address] [place], [place] [address] [phone]

## 2023-10-15 ENCOUNTER — Encounter: Payer: Self-pay | Admitting: Neurology

## 2023-10-15 ENCOUNTER — Ambulatory Visit: Payer: Self-pay | Admitting: Neurology

## 2023-10-15 VITALS — BP 116/78 | Ht 72.0 in | Wt 245.0 lb

## 2023-10-15 DIAGNOSIS — G40319 Generalized idiopathic epilepsy and epileptic syndromes, intractable, without status epilepticus: Secondary | ICD-10-CM | POA: Diagnosis not present

## 2023-10-15 MED ORDER — CARBAMAZEPINE ER 100 MG PO CP12: 100.0000 mg | ORAL_CAPSULE | Freq: Every day | ORAL | 3 refills | Status: AC

## 2023-10-15 MED ORDER — CARBAMAZEPINE ER 300 MG PO CP12: 300.0000 mg | ORAL_CAPSULE | Freq: Two times a day (BID) | ORAL | 3 refills | Status: DC

## 2023-10-16 LAB — CARBAMAZEPINE LEVEL, TOTAL: Carbamazepine (Tegretol), S: 5.4 ug/mL (ref 4.0–12.0)

## 2023-10-16 LAB — COMPREHENSIVE METABOLIC PANEL WITH GFR
ALT: 21 IU/L (ref 0–44)
AST: 22 IU/L (ref 0–40)
Albumin: 4.4 g/dL (ref 4.1–5.1)
Alkaline Phosphatase: 90 IU/L (ref 44–121)
BUN/Creatinine Ratio: 15 (ref 9–20)
BUN: 12 mg/dL (ref 6–24)
Bilirubin Total: 0.3 mg/dL (ref 0.0–1.2)
CO2: 20 mmol/L (ref 20–29)
Calcium: 9 mg/dL (ref 8.7–10.2)
Chloride: 104 mmol/L (ref 96–106)
Creatinine, Ser: 0.82 mg/dL (ref 0.76–1.27)
Globulin, Total: 2.6 g/dL (ref 1.5–4.5)
Glucose: 128 mg/dL — ABNORMAL HIGH (ref 70–99)
Potassium: 5 mmol/L (ref 3.5–5.2)
Sodium: 140 mmol/L (ref 134–144)
Total Protein: 7 g/dL (ref 6.0–8.5)
eGFR: 108 mL/min/{1.73_m2} (ref >59)

## 2023-10-16 LAB — CBC WITH DIFFERENTIAL/PLATELET
Basophils Absolute: 0.1 10*3/uL (ref 0.0–0.2)
Basos: 1 %
EOS (ABSOLUTE): 0.2 10*3/uL (ref 0.0–0.4)
Eos: 3 %
Hematocrit: 45.2 % (ref 37.5–51.0)
Hemoglobin: 15 g/dL (ref 13.0–17.7)
Immature Grans (Abs): 0 10*3/uL (ref 0.0–0.1)
Immature Granulocytes: 0 %
Lymphocytes Absolute: 1.7 10*3/uL (ref 0.7–3.1)
Lymphs: 33 %
MCH: 32.6 pg (ref 26.6–33.0)
MCHC: 33.2 g/dL (ref 31.5–35.7)
MCV: 98 fL — ABNORMAL HIGH (ref 79–97)
Monocytes Absolute: 0.4 10*3/uL (ref 0.1–0.9)
Monocytes: 7 %
Neutrophils Absolute: 2.9 10*3/uL (ref 1.4–7.0)
Neutrophils: 56 %
Platelets: 177 10*3/uL (ref 150–450)
RBC: 4.6 x10E6/uL (ref 4.14–5.80)
RDW: 13.1 % (ref 11.6–15.4)
WBC: 5.2 10*3/uL (ref 3.4–10.8)

## 2023-10-16 LAB — VITAMIN D 25 HYDROXY (VIT D DEFICIENCY, FRACTURES): Vit D, 25-Hydroxy: 30.2 ng/mL (ref 30.0–100.0)

## 2023-10-17 ENCOUNTER — Ambulatory Visit: Payer: Self-pay | Admitting: Neurology

## 2023-11-22 ENCOUNTER — Other Ambulatory Visit: Payer: Self-pay | Admitting: Neurology

## 2023-12-06 ENCOUNTER — Encounter: Payer: Self-pay | Admitting: Neurology

## 2023-12-07 ENCOUNTER — Telehealth: Payer: Self-pay

## 2024-01-15 ENCOUNTER — Other Ambulatory Visit: Payer: Self-pay | Admitting: Neurology

## 2024-10-19 ENCOUNTER — Ambulatory Visit: Admitting: Neurology
